# Patient Record
Sex: Female | Born: 1955 | Hispanic: Yes | Smoking: Current every day smoker
Health system: Southern US, Community
[De-identification: ages and names within clinical notes are randomized; demographics above are authoritative.]

## PROBLEM LIST (undated history)

## (undated) DIAGNOSIS — N200 Calculus of kidney: Secondary | ICD-10-CM

## (undated) DIAGNOSIS — I10 Essential (primary) hypertension: Secondary | ICD-10-CM

## (undated) DIAGNOSIS — N63 Unspecified lump in unspecified breast: Secondary | ICD-10-CM

## (undated) DIAGNOSIS — D0510 Intraductal carcinoma in situ of unspecified breast: Secondary | ICD-10-CM

## (undated) DIAGNOSIS — C50919 Malignant neoplasm of unspecified site of unspecified female breast: Secondary | ICD-10-CM

## (undated) HISTORY — DX: Essential (primary) hypertension: I10

## (undated) HISTORY — PX: WISDOM TOOTH EXTRACTION: SHX21

## (undated) HISTORY — DX: Unspecified lump in unspecified breast: N63.0

## (undated) HISTORY — DX: Intraductal carcinoma in situ of unspecified breast: D05.10

---

## 1979-10-19 HISTORY — PX: TUBAL LIGATION: SHX77

## 1994-10-18 HISTORY — PX: SHOULDER ARTHROSCOPY: SHX128

## 2005-08-09 ENCOUNTER — Emergency Department: Payer: Self-pay | Admitting: Emergency Medicine

## 2006-11-24 ENCOUNTER — Emergency Department: Payer: Self-pay | Admitting: Unknown Physician Specialty

## 2008-09-18 ENCOUNTER — Emergency Department: Payer: Self-pay | Admitting: Internal Medicine

## 2008-10-03 ENCOUNTER — Emergency Department: Payer: Self-pay | Admitting: Internal Medicine

## 2011-04-08 ENCOUNTER — Observation Stay: Payer: Self-pay | Admitting: Internal Medicine

## 2011-10-19 DIAGNOSIS — C50919 Malignant neoplasm of unspecified site of unspecified female breast: Secondary | ICD-10-CM

## 2011-10-19 HISTORY — DX: Malignant neoplasm of unspecified site of unspecified female breast: C50.919

## 2012-10-18 DIAGNOSIS — N63 Unspecified lump in unspecified breast: Secondary | ICD-10-CM

## 2012-10-18 DIAGNOSIS — D0510 Intraductal carcinoma in situ of unspecified breast: Secondary | ICD-10-CM

## 2012-10-18 HISTORY — DX: Intraductal carcinoma in situ of unspecified breast: D05.10

## 2012-10-18 HISTORY — DX: Unspecified lump in unspecified breast: N63.0

## 2012-11-22 ENCOUNTER — Emergency Department: Payer: Self-pay | Admitting: Emergency Medicine

## 2012-12-07 ENCOUNTER — Ambulatory Visit: Payer: Self-pay | Admitting: Internal Medicine

## 2013-01-22 ENCOUNTER — Ambulatory Visit: Payer: Self-pay | Admitting: Internal Medicine

## 2013-03-27 ENCOUNTER — Ambulatory Visit: Payer: Self-pay | Admitting: Internal Medicine

## 2013-04-23 ENCOUNTER — Ambulatory Visit (INDEPENDENT_AMBULATORY_CARE_PROVIDER_SITE_OTHER): Payer: Medicaid Other | Admitting: General Surgery

## 2013-04-23 ENCOUNTER — Encounter: Payer: Self-pay | Admitting: General Surgery

## 2013-04-23 ENCOUNTER — Other Ambulatory Visit: Payer: Self-pay

## 2013-04-23 VITALS — BP 120/74 | HR 72 | Resp 12 | Ht 60.0 in | Wt 132.0 lb

## 2013-04-23 DIAGNOSIS — R928 Other abnormal and inconclusive findings on diagnostic imaging of breast: Secondary | ICD-10-CM

## 2013-04-23 DIAGNOSIS — N63 Unspecified lump in unspecified breast: Secondary | ICD-10-CM

## 2013-04-23 DIAGNOSIS — R921 Mammographic calcification found on diagnostic imaging of breast: Secondary | ICD-10-CM

## 2013-04-23 NOTE — Patient Instructions (Addendum)
Patient to have right breast stereotactic breast biopsy done.   Breast Biopsy, Stereotactic A stereotactic breast biopsy takes a tissue sample from the breast with a special instrument. This is done when:  The problem (lump, abnormality, mass) can be seen on X-ray, but not felt on physical exam.  Suspicious, small calcium deposits (calcifications) are seen in the breast.  There is a change in shape or appearance of the breast, thickening, or asymmetry on mammogram (breast X-ray).  You have nipple changes (unusual or bloody discharge, crusting, retraction, dimpling).  Your caregiver is making a surgical diagnosis. The biopsy may be done on a special table, with your face down and your breasts placed through openings in the table. Computerized imaging (special form of X-rays) is used. The images are not obtained using regular X-ray film. So, exposure to radiation is reduced. Images are seen through several different angles. The surgeon removes small pieces of the suspicious tissue through a hollow needle. The tissue will be sent to the lab for analysis. The surgeon can look at the pictures right away, rather than wait for an X-ray to be developed. Your caregiver can mark the lesions (abnormal tissue formations) electronically. Then the computer can tell exactly where the problem is, or if it has moved. BENEFITS OF THE PROCEDURE  This is a good way to see if tiny lumps, abnormal looking tissue, or calcium deposits that you cannot feel are cancerous or require further treatment or follow-up.  Needle biopsy is a simple procedure. It may be performed in an outpatient imaging center. This means you have the procedure and go home the same day, without checking into a hospital.  It is less painful than open surgery. The results are as accurate as when a tissue sample is removed surgically.  The procedure is faster, less expensive, less invasive, does not distort the breast, and leaves little or no  scar.  Breast defects, which can make future mammograms hard to read and interpret, do not remain.  Recovery time is brief. Patients can soon resume their normal activities.  Using VAD (vacuum assisted device) may make it possible to remove entire lesions.  A breast biopsy can indicate if you need surgery, other treatment, or combined treatment. LET YOUR CAREGIVER KNOW ABOUT:  Allergies.  Medications taken, including herbs, eye drops, over-the-counter medications, and creams.  Use of steroids (by mouth or creams).  Previous problems with anesthetics or numbing medication.  If you are taking blood thinner medications or aspirin.  Possibility of pregnancy, if this applies.  History of blood clots (thrombophlebitis).  History of bleeding or blood problems.  Previous surgery.  Other health problems. RISKS AND COMPLICATIONS  Infection (germ growing in the wound). This can often be treated with antibiotics.  Bleeding, following surgery. Your surgeon takes every precaution to keep this from happening.  There is some concern that if a cancerous mass is present, cancer cells might be spread by the needle. Whether this actually happens is not known. It does not appear to be a significant risk.  X-ray guided breast biopsy is not infallible (not always correct). The problem may be missed or the extent of the problem may be underestimated. This would mean the biopsy did not manage to remove a piece of the diseased tissue or enough of the diseased tissue.  Lesions present, with calcium deposits scattered throughout the breast, are difficult to target by stereotactic method. Those lesions near the chest wall also are hard to learn about by this method.  If the mammogram shows only a vague change in tissue density, but no definite mass or nodule, the X-ray guided method may not be successful. Occasionally, even after a successful biopsy, the tissue diagnosis remains uncertain. A surgical  biopsy will be needed, if abnormal or precancerous cells are found on core biopsy.  Altering or deforming of the breast.  Unable to find, or missing the lesion.  Rarely, the needle may go through the chest wall into the lung area. TWO BIOPSY INSTRUMENTS MAY BE USED IN THE PROCEDURE The conventional biopsy device (core needle biopsy device) consists of an inner needle with a trough extending from it at one end, and an overlying sheath. It is attached to a spring-loaded mechanism that propels it forward. The trough fills with tissue. The outer sheath instantly moves forward to cut the tissue and keep it in the trough. Each sample is obtained in a fraction of a second. It is necessary to withdraw the needle after each sample is taken to collect the tissue.  A newer type of instrument, the VAD (vacuum assisted device), uses vacuum pressure to pull breast tissue into a needle and remove it. The needle does not need to be withdrawn after each sampling. Another advantage is that biopsies are obtained in an orderly manner, by rotating the device. This helps make sure that the entire area of interest will be sampled. When using the automated core biopsy needle, sampling is more random.  FOR COMFORT DURING THE TEST  Relax as much as possible.  Try to follow instructions, to speed up the test.  Let your caregiver know if you are uncomfortable, anxious, or in pain. PROCEDURE  You are awake during the procedure, and you go home the same day (outpatient). A specially trained radiologist will do this procedure. First, the skin is cleansed. Then, it is injected with a local anesthetic. A small nick is made in the skin, and the tip of the biopsy needle is put into the calculated site of the lesion. A special mammography machine uses ionizing radiation to help guide the radiologist's instrument to the site of the abnormal growth. At this point, stereo images are again obtained, to confirm that the needle tip is at  the problem area. Usually 5 to 10 samples are collected when doing a core biopsy. At least 12 are collected when using the vacuum assisted device (VAD). Then, a final set of images is obtained. If they show that the lesion has been mostly or completely removed, a small clip is left at the biopsy site. This is so that it can be easily located, in case the lesion turns out to be cancer. Afterward, the skin opening is stitched (sutured) or taped closed, and covered with a dressing. Your caregiver may apply a pressure dressing and an ice pack, to prevent bleeding and swelling in the breast.  X-ray guided breast biopsy can take 30 minutes to 1 hour, or more. The X-rays usually have no side effects, and no radiation remains in your body. There is usually little or no pain. Usually no scar is left from the tiny skin incision. Many women find that the major discomfort of the procedure is from lying on their stomach, or staying in 1 position for the length of the procedure. This discomfort may be reduced by carefully placed cushions. You should wear a good support bra to the procedure. You will be asked to remove jewelry, dentures, eye glasses, metal objects, or clothing that might interfere with the  X-ray images. You may want to have someone with you, to take you home after the procedure. AFTER THE PROCEDURE   After surgery, if you are doing well and have no problems, you will be allowed to go home.  You may resume your regular diet, or as directed by your caregiver. HOME CARE INSTRUCTIONS   Follow your caregiver's recommendations for medications, care of the biopsy site, follow-up appointments, and further treatment.  Only take over-the-counter or prescription medicines for pain, discomfort, or fever as directed by your caregiver.  An ice pack applied to the affected area may help with discomfort and keep the swelling down.  Change dressings as directed.  Wear a good support bra for as long as your  caregiver recommends.  Avoid strenuous activity for at least 24 hours, or as advised by your caregiver. Finding out the results of your test Not all test results are available during your visit. If your test results are not back during the visit, make an appointment with your caregiver to find out the results. Do not assume everything is normal if you have not heard from your caregiver or the medical facility. It is important for you to follow up on all of your test results.  SEEK MEDICAL CARE IF:   You develop a rash.  You have problems with your medicines.  You become lightheaded or dizzy. SEEK IMMEDIATE MEDICAL CARE IF:   There is increased bleeding (more than a small spot) from the biopsy site.  You notice redness, swelling, or increasing pain in the wound.  Pus is coming from the wound.  You have a fever.  You notice a bad smell coming from the wound or dressing.  You develop shortness of breath.  You develop chest pain.  You pass out. Document Released: 07/03/2003 Document Revised: 12/27/2011 Document Reviewed: 08/08/2009 [facility] Patient Information 2014 [place], [place].      CARE AFTER BREAST BIOPSY  1. Leave the dressing on that your doctor applied after surgery. It is waterproof. You may bathe, shower and/or swim. The dressing will probably remain intact until your return office visit. If the dressing comes off, you will see small strips of tape against your skin on the incision. Do not remove these strips.  2. You may want to use a gauze,cloth or similar protection in your bra to prevent rubbing against your dressing and incision. This is not necessary, but you may feel more comfortable doing so.  3. It is recommended that you wear a bra day and night to give support to the breast. This will prevent the weight of the breast from pulling on the incision.  4. Your breast will feel hard and lumpy under the incision. Do not be alarmed. This is the underlying stitching  of tissue. Softening of this tissue will occur in time.  5. Make sure you call the office and schedule an appointment in one week after your surgery. The office phone number is [phone]. The [facility] at [facility] may have already done this for you.  6. You will notice about a week after your office visit that the strips of the tape on your incision will begin to loosen. These may then be removed.  7. Report to your doctor any of the following:  * Severe pain not relieved by your pain medication  *Redness of the incision  * Drainage from the incision  *Fever greater than 101 degrees  Patient has been scheduled for a right breast stereotactic biopsy  at [facility] for 05-07-13 at 2 pm. She will check-in at the [facility] at 1:30 pm. This patient is aware of date, time, and instructions. Patient verbalizes understanding.

## 2013-04-23 NOTE — Progress Notes (Signed)
Patient ID: Tricia Wilson, female   DOB: 06/15/1956, 57 y.o.   MRN: [id number]  Chief Complaint  Patient presents with  . Other    mammogram    HPI Tricia Wilson is a 57 y.o. female here today for an abnormal mammogram done at [facility] on 03/27/13 with a birad category 4. A left breast ultrasound was performed that same day. The patient does do self breast checks but does not get regular mammograms. She has a family history of two maternal aunts with breast cancer. She denies any complaints with her breasts at this time.    HPI  Past Medical History  Diagnosis Date  . Hypertension     Past Surgical History  Procedure Laterality Date  . Tubal ligation    . Spine surgery      Family History  Problem Relation Age of Onset  . Cancer Maternal Aunt     breast  . Cancer Maternal Aunt     breast    Social History History  Substance Use Topics  . Smoking status: Current Every Day Smoker -- 5.00 packs/day for 20 years  . Smokeless tobacco: Never Used  . Alcohol Use: Yes    Allergies  Allergen Reactions  . Motrin (Ibuprofen)     Current Outpatient Prescriptions  Medication Sig Dispense Refill  . amLODipine (NORVASC) 5 MG tablet Take 5 mg by mouth daily.      . hydrochlorothiazide (HYDRODIURIL) 25 MG tablet Take 25 mg by mouth daily.       No current facility-administered medications for this visit.    Review of Systems Review of Systems  Constitutional: Negative.   Respiratory: Negative.   Cardiovascular: Negative.     Blood pressure 120/74, pulse 72, resp. rate 12, height 5' (1.524 m), weight 132 lb (59.875 kg).  Physical Exam Physical Exam  Constitutional: She is oriented to person, place, and time. She appears well-developed and well-nourished.  Eyes: Conjunctivae are normal. No scleral icterus.  Neck: Trachea normal. No mass and no thyromegaly present.  Cardiovascular: Normal rate, regular rhythm and normal heart sounds.   No murmur heard. Pulmonary/Chest: Effort  normal and breath sounds normal. Right breast exhibits no inverted nipple, no mass, no nipple discharge, no skin change and no tenderness. Left breast exhibits no inverted nipple, no mass, no nipple discharge, no skin change and no tenderness. Breasts are symmetrical.  Lymphadenopathy:    She has no cervical adenopathy.    She has no axillary adenopathy.  Neurological: She is alert and oriented to person, place, and time.  Skin: Skin is warm and dry.    Data Reviewed Mammogram and ultrasound reviewed.   Assessment    Left breast mass seen by ultrasound.  Right breast microcalcification's seen by mammography.    Plan    Left breast core biopsy and right breast stereo biopsy. Procedure risk and benefits explained to patient in full. Her Daughter Tricia Wilson) was present to interpret.     Patient has been scheduled for a right breast stereotactic biopsy at [facility] for 05-07-13 at 2 pm. She will check-in at the [facility] at 1:30 pm. This patient is aware of date, time, and instructions. Patient verbalizes understanding.   CORE BREAST BIOPSY REPORT  Name:  Tricia Wilson DOB:  [birth year]  Vital signs:BP 120/74  Pulse 72  Resp 12  Ht 5' (1.524 m)  Wt 132 lb (59.875 kg)  BMI 25.78 kg/m2  Lesion:  Mass  Location:  Left  1 o'clock  position  Local anesthetic:   8 ml of 1% Xylocaine/0.5% Marcaine  Prep:  chloroprep  Device:  14Gauge   Bard    Ultrasound guidance:  used  Patient tolerance:  Patient tolerated the procedure well   Approach: CC  Clip:not used  Dressing: steristrips,telfa and tegaderm  Ice pack applied. Written instructions provided to patient regarding wound care.   Patient advised that patient will be contacted by phone when pathology report is available.  Followup appointment to be scheduled after pathology.   CC: MASOUD,JAVED, MD, Tarri Glenn G 04/23/2013, 9:06 PM

## 2013-04-24 LAB — PATHOLOGY

## 2013-04-25 ENCOUNTER — Telehealth: Payer: Self-pay | Admitting: *Deleted

## 2013-04-25 ENCOUNTER — Encounter: Payer: Self-pay | Admitting: General Surgery

## 2013-04-25 NOTE — Telephone Encounter (Signed)
Notified patient as instructed, left breast biopsy results benign, with daughter assist, patient pleased. Discussed follow-up appointments for right breast stereo, patient agrees

## 2013-04-25 NOTE — Telephone Encounter (Signed)
Message copied by Currie Paris on Wed Apr 25, 2013  8:27 AM ------      Message from: Kieth Brightly      Created: Tue Apr 24, 2013  5:10 PM       Please let pt pt know the pathology was normal. She is scheduled for right breast stereo. ------

## 2013-05-03 ENCOUNTER — Ambulatory Visit: Payer: Self-pay | Admitting: General Surgery

## 2013-05-07 ENCOUNTER — Ambulatory Visit: Payer: Self-pay | Admitting: General Surgery

## 2013-05-07 DIAGNOSIS — R920 Mammographic microcalcification found on diagnostic imaging of breast: Secondary | ICD-10-CM

## 2013-05-08 ENCOUNTER — Telehealth: Payer: Self-pay | Admitting: *Deleted

## 2013-05-08 NOTE — Telephone Encounter (Signed)
Per Dr. 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

## 2013-05-09 ENCOUNTER — Encounter: Payer: Self-pay | Admitting: General Surgery

## 2013-05-10 ENCOUNTER — Ambulatory Visit (INDEPENDENT_AMBULATORY_CARE_PROVIDER_SITE_OTHER): Payer: Medicaid Other | Admitting: General Surgery

## 2013-05-10 ENCOUNTER — Other Ambulatory Visit: Payer: Self-pay

## 2013-05-10 ENCOUNTER — Encounter: Payer: Self-pay | Admitting: General Surgery

## 2013-05-10 VITALS — Ht 62.0 in | Wt 132.0 lb

## 2013-05-10 DIAGNOSIS — N63 Unspecified lump in unspecified breast: Secondary | ICD-10-CM

## 2013-05-10 DIAGNOSIS — D0511 Intraductal carcinoma in situ of right breast: Secondary | ICD-10-CM

## 2013-05-10 DIAGNOSIS — D0590 Unspecified type of carcinoma in situ of unspecified breast: Secondary | ICD-10-CM

## 2013-05-10 DIAGNOSIS — D0510 Intraductal carcinoma in situ of unspecified breast: Secondary | ICD-10-CM | POA: Insufficient documentation

## 2013-05-10 NOTE — Progress Notes (Signed)
Patient ID: Tricia Wilson, female   DOB: October 25, 1955, 57 y.o.   MRN: [id number] Pt here for discussion on path. Left breast biopsy showed benign breast tissue. This is discordant and likely needs excision of the mass at 2 o'cl on left. However [place] today showed no defined mass. Stereo bx of right breast showed a tiny focus of DCIS. Pt advised fully on the diagnosis.  Treatment options explained. Pt is agreeable to lumpectomy right breast

## 2013-05-11 NOTE — Addendum Note (Signed)
Addended by: Kieth Brightly on: 05/11/2013 07:26 AM   Modules accepted: Orders

## 2013-05-16 ENCOUNTER — Ambulatory Visit: Payer: Self-pay | Admitting: General Surgery

## 2013-05-16 DIAGNOSIS — D0590 Unspecified type of carcinoma in situ of unspecified breast: Secondary | ICD-10-CM

## 2013-05-16 HISTORY — PX: BREAST SURGERY: SHX581

## 2013-05-17 ENCOUNTER — Ambulatory Visit: Payer: Medicaid Other

## 2013-05-17 LAB — PATHOLOGY REPORT

## 2013-05-18 ENCOUNTER — Ambulatory Visit: Payer: Self-pay | Admitting: Internal Medicine

## 2013-05-21 ENCOUNTER — Encounter: Payer: Self-pay | Admitting: General Surgery

## 2013-05-23 ENCOUNTER — Encounter: Payer: Self-pay | Admitting: General Surgery

## 2013-05-24 ENCOUNTER — Encounter: Payer: Self-pay | Admitting: General Surgery

## 2013-05-31 ENCOUNTER — Encounter: Payer: Self-pay | Admitting: General Surgery

## 2013-05-31 ENCOUNTER — Ambulatory Visit (INDEPENDENT_AMBULATORY_CARE_PROVIDER_SITE_OTHER): Payer: Medicaid Other | Admitting: General Surgery

## 2013-05-31 VITALS — BP 110/80 | HR 76 | Resp 12 | Ht 60.0 in | Wt 133.0 lb

## 2013-05-31 DIAGNOSIS — D0590 Unspecified type of carcinoma in situ of unspecified breast: Secondary | ICD-10-CM

## 2013-05-31 DIAGNOSIS — D0511 Intraductal carcinoma in situ of right breast: Secondary | ICD-10-CM

## 2013-05-31 NOTE — Patient Instructions (Addendum)
Patient to see Dr. Rushie Chestnut. This patient is to return in 2 months.   Patient has been scheduled for an appointment with Dr. Rushie Chestnut at the [facility] for 06-05-13 at 11:30 am. She is aware of date, time, and instructions.

## 2013-05-31 NOTE — Progress Notes (Signed)
Patient ID: Tricia Wilson, female   DOB: Jun 12, 1956, 57 y.o.   MRN: [id number]  Patient presents for a post op right breast lumpectomy. The procedure was done on 05/16/13. The patient complains of some dizziness that comes and goes. She also states she is having a pinching sensation in the area of the incision site. She is still using Tramadol for pain but states it is not helping her.   Exam shows a clean incision around the areola on right side. No signs of infection, hematoma, or seroma.    Pathology no residual DCIS. Margins clear. ER PR positive.   Patient has been scheduled for an appointment with Dr. Rushie Chestnut at the [facility] for 06-05-13 at 11:30 am. She is aware of date, time, and instructions.

## 2013-06-05 ENCOUNTER — Ambulatory Visit: Payer: Self-pay | Admitting: Radiation Oncology

## 2013-06-18 ENCOUNTER — Ambulatory Visit: Payer: Self-pay | Admitting: Internal Medicine

## 2013-06-18 ENCOUNTER — Ambulatory Visit: Payer: Self-pay | Admitting: Radiation Oncology

## 2013-07-05 LAB — CBC CANCER CENTER
Basophil %: 0.5 %
Eosinophil %: 1.4 %
Lymphocyte %: 28.2 %
MCHC: 33.3 g/dL (ref 32.0–36.0)
MCV: 93 fL (ref 80–100)
Monocyte #: 0.6 x10 3/mm (ref 0.2–0.9)
RDW: 13.5 % (ref 11.5–14.5)

## 2013-07-12 LAB — CBC CANCER CENTER
Eosinophil #: 0.1 x10 3/mm (ref 0.0–0.7)
Eosinophil %: 1.3 %
HGB: 15.6 g/dL (ref 12.0–16.0)
Lymphocyte %: 32.3 %
MCH: 31.6 pg (ref 26.0–34.0)
MCHC: 34.9 g/dL (ref 32.0–36.0)
Monocyte #: 0.5 x10 3/mm (ref 0.2–0.9)
RBC: 4.92 10*6/uL (ref 3.80–5.20)
RDW: 13.1 % (ref 11.5–14.5)
WBC: 6.4 x10 3/mm (ref 3.6–11.0)

## 2013-07-18 ENCOUNTER — Ambulatory Visit: Payer: Self-pay | Admitting: Internal Medicine

## 2013-07-18 ENCOUNTER — Ambulatory Visit: Payer: Self-pay | Admitting: Radiation Oncology

## 2013-07-19 LAB — CBC CANCER CENTER
Basophil #: 0.1 x10 3/mm (ref 0.0–0.1)
Basophil %: 1.1 %
HCT: 48.1 % — ABNORMAL HIGH (ref 35.0–47.0)
HGB: 16.5 g/dL — ABNORMAL HIGH (ref 12.0–16.0)
Monocyte #: 0.4 x10 3/mm (ref 0.2–0.9)
Monocyte %: 6.5 %
Neutrophil #: 4.3 x10 3/mm (ref 1.4–6.5)
Neutrophil %: 63.3 %
RDW: 13.6 % (ref 11.5–14.5)

## 2013-07-26 LAB — CBC CANCER CENTER
Eosinophil #: 0.1 x10 3/mm (ref 0.0–0.7)
Eosinophil %: 1.4 %
HGB: 15 g/dL (ref 12.0–16.0)
Lymphocyte #: 1.8 x10 3/mm (ref 1.0–3.6)
MCV: 93 fL (ref 80–100)
Monocyte %: 5.6 %
Neutrophil #: 4.2 x10 3/mm (ref 1.4–6.5)
Platelet: 223 x10 3/mm (ref 150–440)
RDW: 13.5 % (ref 11.5–14.5)
WBC: 6.6 x10 3/mm (ref 3.6–11.0)

## 2013-08-01 ENCOUNTER — Encounter: Payer: Self-pay | Admitting: General Surgery

## 2013-08-01 ENCOUNTER — Ambulatory Visit (INDEPENDENT_AMBULATORY_CARE_PROVIDER_SITE_OTHER): Payer: Medicaid Other | Admitting: General Surgery

## 2013-08-01 VITALS — BP 130/80 | HR 78 | Resp 12 | Ht 60.0 in | Wt 136.0 lb

## 2013-08-01 DIAGNOSIS — D0511 Intraductal carcinoma in situ of right breast: Secondary | ICD-10-CM

## 2013-08-01 DIAGNOSIS — D0590 Unspecified type of carcinoma in situ of unspecified breast: Secondary | ICD-10-CM

## 2013-08-01 MED ORDER — TRAMADOL HCL 50 MG PO TABS: 50.0000 mg | ORAL_TABLET | Freq: Three times a day (TID) | ORAL | Status: DC | PRN

## 2013-08-01 NOTE — Progress Notes (Signed)
Patient ID: Tricia Wilson, female   DOB: 1956-07-03, 57 y.o.   MRN: [id number]  Chief Complaint  Patient presents with  . Follow-up    right breast    HPI [name] is a 57 y.o. 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 Patient states her right breast is very itchy.She uses a cream with no relief of symptoms. Patient has completed 4 weeks of radiation and has 2 more weeks remaining.  HPI  Past Medical History  Diagnosis Date  . Hypertension   . Cancer 2014    rt breast ductal carcinoma in situ  . Breast lump 2014    Past Surgical History  Procedure Laterality Date  . Tubal ligation    . Spine surgery    . Breast surgery Right 2014    lumpectomy    Family History  Problem Relation Age of Onset  . Cancer Maternal Aunt     breast  . Cancer Maternal Aunt     breast    Social History History  Substance Use Topics  . Smoking status: Current Every Day Smoker -- 5.00 packs/day for 20 years  . Smokeless tobacco: Never Used  . Alcohol Use: Yes    Allergies  Allergen Reactions  . Motrin [Ibuprofen]     Current Outpatient Prescriptions  Medication Sig Dispense Refill  . amLODipine (NORVASC) 5 MG tablet Take 5 mg by mouth daily.      . hydrochlorothiazide (HYDRODIURIL) 25 MG tablet Take 25 mg by mouth daily.      . traMADol (ULTRAM) 50 MG tablet Take 1 tablet (50 mg total) by mouth 3 (three) times daily as needed for pain.  30 tablet  0   No current facility-administered medications for this visit.    Review of Systems Review of Systems  Constitutional: Negative.   Respiratory: Negative.   Cardiovascular: Negative.     Blood pressure 130/80, pulse 78, resp. rate 12, height 5' (1.524 m), weight 136 lb (61.689 kg).  Physical Exam Physical Exam  Constitutional: She is oriented to person, place, and time. She appears well-developed and well-nourished.  Neurological: She is alert and oriented to person,  place, and time.  Right breast shows radiational skin changes but no open areas or drainage. Lumpectomy site near the areola looks intact and no nipple changes seen.   Data Reviewed    Assessment    DCIS R breast, ER/PR positive. Completing radiation treatment.      Plan    Advised to use zinc oxide for breast itchiness. Follow-up in three weeks.    Tramadol refill called in to pharmacy.    Grier Vu G 08/01/2013, 4:30 PM

## 2013-08-02 LAB — CBC CANCER CENTER
Basophil #: 0.1 x10 3/mm (ref 0.0–0.1)
Basophil %: 1.1 %
Lymphocyte #: 1.5 x10 3/mm (ref 1.0–3.6)
Lymphocyte %: 23.1 %
MCHC: 34.1 g/dL (ref 32.0–36.0)
Monocyte #: 0.4 x10 3/mm (ref 0.2–0.9)
Neutrophil %: 68 %
Platelet: 236 x10 3/mm (ref 150–440)
RDW: 13.2 % (ref 11.5–14.5)

## 2013-08-09 LAB — CBC CANCER CENTER
Basophil #: 0 x10 3/mm (ref 0.0–0.1)
Eosinophil #: 0.2 x10 3/mm (ref 0.0–0.7)
Eosinophil %: 3.2 %
HCT: 46.7 % (ref 35.0–47.0)
HGB: 15.9 g/dL (ref 12.0–16.0)
MCHC: 34.1 g/dL (ref 32.0–36.0)
MCV: 94 fL (ref 80–100)
Monocyte #: 0.4 x10 3/mm (ref 0.2–0.9)
Monocyte %: 6.9 %
Platelet: 198 x10 3/mm (ref 150–440)
RBC: 4.95 10*6/uL (ref 3.80–5.20)
RDW: 13.7 % (ref 11.5–14.5)
WBC: 5.9 x10 3/mm (ref 3.6–11.0)

## 2013-08-14 LAB — CREATININE, SERUM
Creatinine: 1.1 mg/dL (ref 0.60–1.30)
EGFR (African American): >60

## 2013-08-14 LAB — CBC CANCER CENTER
Eosinophil #: 0.1 x10 3/mm (ref 0.0–0.7)
Eosinophil %: 2.6 %
Lymphocyte #: 1.1 x10 3/mm (ref 1.0–3.6)
MCH: 31.9 pg (ref 26.0–34.0)
MCHC: 34.5 g/dL (ref 32.0–36.0)
Monocyte #: 0.3 x10 3/mm (ref 0.2–0.9)
Monocyte %: 6.4 %
Neutrophil #: 3.6 x10 3/mm (ref 1.4–6.5)
Neutrophil %: 68.1 %
Platelet: 197 x10 3/mm (ref 150–440)
RBC: 4.56 10*6/uL (ref 3.80–5.20)

## 2013-08-14 LAB — HEPATIC FUNCTION PANEL A (ARMC)
Albumin: 3.3 g/dL — ABNORMAL LOW (ref 3.4–5.0)
Alkaline Phosphatase: 89 U/L (ref 50–136)
Bilirubin,Total: 0.4 mg/dL (ref 0.2–1.0)

## 2013-08-18 ENCOUNTER — Ambulatory Visit: Payer: Self-pay | Admitting: Radiation Oncology

## 2013-08-18 ENCOUNTER — Ambulatory Visit: Payer: Self-pay | Admitting: Internal Medicine

## 2013-08-21 ENCOUNTER — Encounter: Payer: Self-pay | Admitting: General Surgery

## 2013-08-21 ENCOUNTER — Ambulatory Visit (INDEPENDENT_AMBULATORY_CARE_PROVIDER_SITE_OTHER): Payer: Medicaid Other | Admitting: General Surgery

## 2013-08-21 VITALS — BP 124/80 | HR 86 | Resp 12 | Ht 60.0 in | Wt 136.0 lb

## 2013-08-21 DIAGNOSIS — D0511 Intraductal carcinoma in situ of right breast: Secondary | ICD-10-CM

## 2013-08-21 DIAGNOSIS — D0590 Unspecified type of carcinoma in situ of unspecified breast: Secondary | ICD-10-CM

## 2013-08-21 NOTE — Progress Notes (Deleted)
Patient ID: Tricia Wilson, female   DOB: 1956-09-20, 57 y.o.   MRN: [id number]

## 2013-08-21 NOTE — Patient Instructions (Signed)
Right diagnostic mammogram and office visit in January 2015

## 2013-08-21 NOTE — Progress Notes (Signed)
Patient ID: Tricia Wilson, female   DOB: [birth year], 57 y.o.   MRN: [id number]  Chief Complaint  Patient presents with  . Routine Post Op    right breast    HPI Tricia Wilson is a 57 y.o. female.  Here for her follow up.  She completed radiation 08-17-13.  States she is feeling tired.  She started her Tamoxifen yesterday, prescription from Dr. Sherrlyn Hock. No new breast issues. States she starts physical therapy next week. HPI  Past Medical History  Diagnosis Date  . Hypertension   . Cancer 2014    rt breast ductal carcinoma in situ  . Breast lump 2014    Past Surgical History  Procedure Laterality Date  . Tubal ligation    . Spine surgery    . Breast surgery Right 05-16-2013    lumpectomy    Family History  Problem Relation Age of Onset  . Cancer Maternal Aunt     breast  . Cancer Maternal Aunt     breast    Social History History  Substance Use Topics  . Smoking status: Current Every Day Smoker -- 5.00 packs/day for 20 years  . Smokeless tobacco: Never Used  . Alcohol Use: Yes    Allergies  Allergen Reactions  . Motrin [Ibuprofen]     Current Outpatient Prescriptions  Medication Sig Dispense Refill  . tamoxifen (NOLVADEX) 20 MG tablet Take 20 mg by mouth daily.      Marland Kitchen amLODipine (NORVASC) 5 MG tablet Take 5 mg by mouth daily.      . hydrochlorothiazide (HYDRODIURIL) 25 MG tablet Take 25 mg by mouth daily.      . traMADol (ULTRAM) 50 MG tablet Take 1 tablet (50 mg total) by mouth 3 (three) times daily as needed for pain.  30 tablet  0   No current facility-administered medications for this visit.    Review of Systems Review of Systems  Constitutional: Positive for fatigue.  Respiratory: Negative.   Cardiovascular: Negative.     Blood pressure 124/80, pulse 86, resp. rate 12, height 5' (1.524 m), weight 136 lb (61.689 kg).  Physical Exam Physical Exam  Constitutional: She is oriented to person, place, and time. She appears well-developed and well-nourished.   Neck: Neck supple. No thyromegaly present.  Pulmonary/Chest: Right breast exhibits no inverted nipple, no mass, no nipple discharge, no skin change and no tenderness. Left breast exhibits no inverted nipple, no mass, no nipple discharge, no skin change and no tenderness.  Right breast with skin changes from radiation but no open areas.  Lymphadenopathy:    She has no cervical adenopathy.    She has no axillary adenopathy.  Neurological: She is alert and oriented to person, place, and time.  Skin: Skin is warm and dry.    Data Reviewed    Assessment    DCIS right breast     Plan    F/u in 2 mos with right diagnostic mammogram.        Lindsay Soulliere G 08/21/2013, 2:58 PM

## 2013-09-21 ENCOUNTER — Ambulatory Visit: Payer: Self-pay | Admitting: Radiation Oncology

## 2013-10-23 ENCOUNTER — Ambulatory Visit: Payer: Self-pay | Admitting: General Surgery

## 2013-10-24 ENCOUNTER — Encounter: Payer: Self-pay | Admitting: General Surgery

## 2013-10-31 ENCOUNTER — Ambulatory Visit: Payer: Medicaid Other | Admitting: General Surgery

## 2013-11-27 ENCOUNTER — Encounter: Payer: Self-pay | Admitting: *Deleted

## 2013-12-17 ENCOUNTER — Ambulatory Visit: Payer: Self-pay | Admitting: Internal Medicine

## 2014-08-15 ENCOUNTER — Ambulatory Visit: Payer: Self-pay | Admitting: Internal Medicine

## 2014-08-19 ENCOUNTER — Encounter: Payer: Self-pay | Admitting: General Surgery

## 2014-08-20 ENCOUNTER — Other Ambulatory Visit: Payer: Self-pay | Admitting: Cardiology

## 2014-08-20 DIAGNOSIS — R921 Mammographic calcification found on diagnostic imaging of breast: Secondary | ICD-10-CM

## 2014-08-28 ENCOUNTER — Ambulatory Visit
Admission: RE | Admit: 2014-08-28 | Discharge: 2014-08-28 | Disposition: A | Discharge status: Home / Self Care | Payer: Medicaid Other | Source: Ambulatory Visit | Attending: Cardiology | Admitting: Cardiology

## 2014-08-28 DIAGNOSIS — R921 Mammographic calcification found on diagnostic imaging of breast: Secondary | ICD-10-CM

## 2014-08-28 MED ORDER — GADOBENATE DIMEGLUMINE 529 MG/ML IV SOLN
12.0000 mL | Freq: Once | INTRAVENOUS | Status: AC | PRN
  Administered 2014-08-28: 12 mL via INTRAVENOUS

## 2015-02-07 NOTE — Op Note (Signed)
PATIENT NAME:  Tricia Wilson, PETHTEL MR#:  [id number] DATE OF BIRTH:  Nov 10, 1955  DATE OF PROCEDURE:  05/16/2013  PREOPERATIVE DIAGNOSIS: Ductal carcinoma in situ, right breast.   POSTOPERATIVE DIAGNOSIS: Ductal carcinoma in situ, right breast.    OPERATION: Right breast lumpectomy with ultrasound guidance.   SURGEON: S.G. Jamal Collin, MD  ANESTHESIA: General.   COMPLICATIONS: None.   ESTIMATED BLOOD LOSS: Minimal.   DRAINS: None.   DESCRIPTION OF PROCEDURE: The patient was put to sleep in the supine position on the operating table. The right breast was prepped and draped out as a sterile field. Timeout procedure was performed. Ultrasound probe was brought up to the field, and the biopsy cavity with the clip was identified in the retroareolar position in a superior location. This was marked on the skin, and a circumareolar incision was planned along the upper and outer aspect. Incision was then made and carefully deepened into the subcutaneous tissue. The areolar skin was then dissected off from the underlying tissue all the way past the nipple area, and the skin and subcutaneous tissue were dissected off the underlying gland superiorly, and with finger palpation, the biopsy cavity was completely excised out with cautery. Bleeding was controlled with cautery. The excised tissue was tagged for margins and sent to pathology. The deeper tissue was closed with interrupted 2-0 Vicryl stitches, and the skin was closed with subcuticular 4-0 Vicryl, covered with Dermabond. The procedure was well tolerated. No immediate problems were encountered. The patient subsequently was returned to the recovery room in stable condition.   ____________________________ S.Robinette Haines, MD sgs:OSi D: 05/18/2013 14:15:26 ET T: 05/18/2013 14:21:42 ET JOB#: [id number]  cc: S.G. Jamal Collin, MD, <Dictator> [facility] Robinette Haines MD ELECTRONICALLY SIGNED 05/21/2013 9:29

## 2015-02-07 NOTE — Consult Note (Signed)
Reason for Visit: This 59 year old Female patient presents to the clinic for initial evaluation of  breast cancer .   Referred by Dr. Jamal Collin.  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   Planned Treatment Regimen whole breast radiation plus tamoxifen   HPI   patient is a59 year old Spanish female who presented with an abnormal mammogram on January 22, 2013 showing a cluster of microcalcifications in retroalveolar portion of the right breast. This was confirmed on ultrasound to be a BI-RADS for suspicious abnormality and patient underwent wide local excisionfor ductal carcinoma in situ. Tumor was approximately 3 mm. Tumor was low-grade cribriform type ER/PR positive. Her margin was involved should underwent reexcision showing no residual ductal carcinoma in situ. Marker clips were placed.patient has done well postoperatively. She is seen today with the aid of an interpreter and is doing well. Specifically denies breast tenderness cough or bone pain. She is also being seen by medical oncology today for consideration of aromatase inhibitor therapy  Past Hx:    DCIS Right breast:    Tobacco Use:    Hypertension:    Wisdom Tooth Extraction:    Shoulder Surgery:    Back Surgery:    Tubal Ligation Reversal:    Tubal Ligation:   Past, Family and Social History:  Past Medical History positive   Cardiovascular hypertension   Past Surgical History back surgery, shoulder surgery, wisdom tooth extraction, tubal ligation, tubal ligation reversal   Family History positive   Family History Comments maternal aunt with breast cancer   Social History positive   Social History Comments 20-pack-year smoking history smoking  cessation literature has been given to patient. Social EtOH use history   Additional Past Medical and Surgical History accompanied by interpreter today Anderson navigator   Allergies:   Motrin: N/V  Home Meds:  Home Medications: Medication Instructions Status  hydrochlorothiazide 25 mg oral tablet 1 tab(s) orally once a day Active  amLODIPine 5 mg oral tablet 1 tab(s) orally once a day (in the morning) Active  traMADol 50 mg oral tablet 1 tab(s) orally every 6 hours as needed for pain Active   Review of Systems:  General negative   Performance Status (ECOG) 0   Skin negative   Breast see HPI   Ophthalmologic negative   ENMT negative   Respiratory and Thorax negative   Cardiovascular negative   Gastrointestinal negative   Genitourinary negative   Musculoskeletal negative   Neurological negative   Psychiatric negative   Hematology/Lymphatics negative   Endocrine negative   Allergic/Immunologic negative   Nursing Notes:  Nursing Vital Signs and Chemo Nursing Nursing Notes: *CC Vital Signs Flowsheet:   19-Aug-14 11:00  Temp Temperature 96.9  Pulse Pulse 71  Respirations Respirations 18  SBP SBP 122  DBP DBP 84  Pain Scale (0-10)  0  Current Weight (kg) (kg) 60  Height (cm) centimeters 151.5  BSA (m2) 1.5   Physical Exam:  General/Skin/HEENT:  General normal   Skin normal   Eyes normal   ENMT normal   Head and Neck normal   Additional PE well-developed well-nourished female in NAD. She status post wide local excision in the superior portion of the nipple area looks complex of the right breast. Incision is  fresh will healing well. No dominant mass or nodularity is noted in either breast into position examined. No axillary or supraclavicular adenopathy is appreciated. Lungs are clear to A&P cardiac examination shows regular rate and rhythm.   Breasts/Resp/CV/GI/GU:  Respiratory and Thorax normal   Cardiovascular normal   Gastrointestinal  normal   Genitourinary normal   MS/Neuro/Psych/Lymph:  Musculoskeletal normal   Neurological normal   Lymphatics normal   Other Results:  Radiology Results: [place]:    10-Jun-14 11:25, US Breast Left  US Breast Left   REASON FOR EXAM:    av lt nodular densities rt microcals  COMMENTS:       PROCEDURE: [place]  - US BREAST LEFT  - Mar 27 2013 11:25AM     RESULT: Left breast ultrasound reveals a complex lesion with internal   echoes measuring 2 cm in maximum diameter at 2:00 in the left breast;   this corresponds to mammographic abnormality. Surgical evaluation of this   lesion is suggested as this could represent malignancy.    IMPRESSION:  Ill-defined lobular  approximately 2 cm lesion with internal   echoes outer aspect left breast. Surgical evaluation suggested as this   could represent malignancy.    BI-RADS: Category 4 - Suspicious Abnormality - Biopsy Should Be Considered  Thank you for the oppurtunity to contribute to the care of your patient. .        Verified By: Osa Craver, M.D., MD  LabUnknown:    07-Apr-14 10:45, Digital Screen Mammogram  PACS Image     10-Jun-14 11:25, US Breast Left  PACS [place]:    07-Apr-14 10:45, Digital Screen Mammogram  Digital Screen Mammogram   REASON FOR EXAM:    SCR MAMMO  COMMENTS:       PROCEDURE: MAM - MAM DGTL SCREENING MAMMO W/CAD  - Jan 22 2013 10:45AM     RESULT:  No prior studies available for comparison. Nodular densities   noted in the outer aspect of the left breast. Compression spot studies if   need be ultrasound suggest for further evaluation. Scattered   calcifications are noted bilaterally. There is a questionable cluster   microcalcifications in the retroareolar portion of the right breast for   which magnification spot studies are suggested.    IMPRESSION:    1. Nodular densities in the outer aspect left breast for which   compression spot studies if need be ultrasound suggest for further      evaluation.  2. Questionable cluster microcavitation retroareolar portion right breast   for which magnification spot studies suggested for further evaluation.    BI-RADS: Catagory 0 - Need Additional Imaging Evaluation    A NEGATIVE MAMMOGRAM REPORT DOES NOT PRECLUDE BIOPSY OR OTHER EVALUATION   OF A CLINICALLY PALPABLEOR OTHERWISE SUSPICIOUS MASS OR LESION. BREAST   CANCER MAY NOT BE DETECTED IN UP TO 10% OF CASES.    Thank you for the oppurtunity to contribute to the care of your patient. Marland Kitchen    BREAST COMPOSITION: The breast composition is HETEROGENEOUSLY DENSE   (glandular tissue is 51-75%) This may decrease the sensitivity of   mammography.  Addendum: In the above impression microcalcification was misspelled.        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 Would also boost or scar another 1400 cGy. Risks and benefits of treatment including skin irritation, inclusion of possible some superficial lung, alteration of blood counts, were explained in detail to the patient through the interpreter. She acknowledges to comprehend in my treatment plan well. Patient will also be seen by Dr. Ma Hillock and recommendation for tamoxifen therapy after radiation will be made. I have set her up for CT simulation next week to allow some more healing time. I have discussed the case personally with Dr. Ma Hillock and Dr. Jamal Collin .  I would like to take this opportunity to thank you for allowing me to continue to participate in this patient's care.    CC Referral:  cc: Dr. Jamal Collin   Electronic Signatures: Baruch Gouty, Roda Shutters (MD)  (Signed 19-Aug-14 13:31)  Authored: HPI, Diagnosis, Past Hx, PFSH, Allergies, Home Meds, ROS,  Nursing Notes, Physical Exam, Other Results, Encounter Assessment and Plan, CC Referring Physician   Last Updated: 19-Aug-14 13:31 by Armstead Peaks (MD)

## 2015-07-15 ENCOUNTER — Other Ambulatory Visit: Payer: Self-pay | Admitting: *Deleted

## 2015-07-15 ENCOUNTER — Emergency Department: Payer: Medicaid Other

## 2015-07-15 ENCOUNTER — Encounter: Payer: Self-pay | Admitting: Emergency Medicine

## 2015-07-15 ENCOUNTER — Emergency Department
Admission: EM | Admit: 2015-07-15 | Discharge: 2015-07-15 | Disposition: A | Discharge status: Home / Self Care | Payer: Medicaid Other | Attending: Emergency Medicine | Admitting: Emergency Medicine

## 2015-07-15 DIAGNOSIS — N200 Calculus of kidney: Secondary | ICD-10-CM | POA: Insufficient documentation

## 2015-07-15 DIAGNOSIS — S3992XA Unspecified injury of lower back, initial encounter: Secondary | ICD-10-CM | POA: Diagnosis present

## 2015-07-15 DIAGNOSIS — W1839XA Other fall on same level, initial encounter: Secondary | ICD-10-CM | POA: Insufficient documentation

## 2015-07-15 DIAGNOSIS — Y998 Other external cause status: Secondary | ICD-10-CM | POA: Insufficient documentation

## 2015-07-15 DIAGNOSIS — I10 Essential (primary) hypertension: Secondary | ICD-10-CM | POA: Diagnosis not present

## 2015-07-15 DIAGNOSIS — Y9389 Activity, other specified: Secondary | ICD-10-CM | POA: Insufficient documentation

## 2015-07-15 DIAGNOSIS — N6313 Unspecified lump in the right breast, lower outer quadrant: Secondary | ICD-10-CM

## 2015-07-15 DIAGNOSIS — N63 Unspecified lump in breast: Secondary | ICD-10-CM | POA: Diagnosis not present

## 2015-07-15 DIAGNOSIS — S39012A Strain of muscle, fascia and tendon of lower back, initial encounter: Secondary | ICD-10-CM | POA: Diagnosis not present

## 2015-07-15 DIAGNOSIS — Y9289 Other specified places as the place of occurrence of the external cause: Secondary | ICD-10-CM | POA: Insufficient documentation

## 2015-07-15 DIAGNOSIS — Z79899 Other long term (current) drug therapy: Secondary | ICD-10-CM | POA: Diagnosis not present

## 2015-07-15 DIAGNOSIS — Z720 Tobacco use: Secondary | ICD-10-CM | POA: Diagnosis not present

## 2015-07-15 MED ORDER — CYCLOBENZAPRINE HCL 10 MG PO TABS: ORAL_TABLET | ORAL | Status: DC

## 2015-07-15 NOTE — ED Notes (Signed)
Developed right lower back pain yesterday . Denies any recent injury but did fall about 2 months ago. Also denies any urinary sx's  And states pain does not radiates

## 2015-07-15 NOTE — ED Notes (Signed)
Pt to ed with c/o lower back pain since last night.  Pt denies injury

## 2015-07-15 NOTE — ED Provider Notes (Signed)
CSN: [id number]     Arrival date & time 07/15/15  1325 History   First MD Initiated Contact with Patient 07/15/15 1417     Chief Complaint  Patient presents with  . Back Pain    HPI Comments: 59 year old female presents today complaining of lower back pain mainly on the right that began yesterday. She fell a couple months ago but has not had an injury since then. No leg pain, tingling or numbness down her legs. Has not taken anything over the counter.  Pt is also concerned about lump she noticed in her right breast 2 days ago on self breast exam. Pt has a history of right sided breast cancer that was treated at the [facility] here at [facility] by Dr. Inez Pilgrim. She has not been seen by them in over a year. She had a breast MRI in November of 2015 which was normal.   Patient is a 60 y.o. female presenting with back pain. The history is provided by the patient.  Back Pain Location:  Lumbar spine Quality:  Aching Radiates to:  Does not radiate Pain severity:  Moderate Pain is:  Same all the time Onset quality:  Sudden Duration:  48 hours Timing:  Constant Progression:  Unchanged Chronicity:  New Relieved by:  None tried Worsened by:  Movement, standing and ambulation Associated symptoms: no bladder incontinence, no bowel incontinence, no leg pain, no numbness, no paresthesias, no perianal numbness, no tingling and no weakness     Past Medical History  Diagnosis Date  . Hypertension   . Cancer 2014    rt breast ductal carcinoma in situ  . Breast lump 2014   Past Surgical History  Procedure Laterality Date  . Tubal ligation    . Spine surgery    . Breast surgery Right 05-16-2013    lumpectomy   Family History  Problem Relation Age of Onset  . Cancer Maternal Aunt     breast  . Cancer Maternal Aunt     breast   Social History  Substance Use Topics  . Smoking status: Current Every Day Smoker -- 5.00 packs/day for 20 years  . Smokeless tobacco: Never Used  . Alcohol Use: Yes    OB History    Gravida Para Term Preterm AB TAB SAB Ectopic Multiple Living   9         8      Obstetric Comments   1st Menstrual Cycle:  12 1st Pregnancy:  16     Review of Systems  Gastrointestinal: Negative for bowel incontinence.  Genitourinary: Negative for bladder incontinence.  Musculoskeletal: Positive for myalgias, back pain and arthralgias.  Neurological: Negative for tingling, weakness, numbness and paresthesias.  All other systems reviewed and are negative.     Allergies  Motrin  Home Medications   Prior to Admission medications   Medication Sig Start Date End Date Taking? Authorizing Provider  amLODipine (NORVASC) 5 MG tablet Take 5 mg by mouth daily.    Historical Provider, MD  cyclobenzaprine (FLEXERIL) 10 MG tablet 1 tab QHS 07/15/15   Corliss Parish, PA-C  hydrochlorothiazide (HYDRODIURIL) 25 MG tablet Take 25 mg by mouth daily.    Historical Provider, MD  tamoxifen (NOLVADEX) 20 MG tablet Take 20 mg by mouth daily.    Historical Provider, MD  traMADol (ULTRAM) 50 MG tablet Take 1 tablet (50 mg total) by mouth 3 (three) times daily as needed for pain. 08/01/13   Seeplaputhur Robinette Haines, MD  BP 173/101 mmHg  Pulse 94  Temp(Src) 98.3 F (36.8 C) (Oral)  Resp 16  Ht 5\' 7"  (1.702 m)  Wt 135 lb (61.236 kg)  BMI 21.14 kg/m2  SpO2 97% Physical Exam  Constitutional: She is oriented to person, place, and time. Vital signs are normal. She appears well-developed and well-nourished. She is active.  Non-toxic appearance. She does not have a sickly appearance. She does not appear ill.  HENT:  Head: Normocephalic and atraumatic.  Pulmonary/Chest: Right breast exhibits mass and tenderness.  No supraclavicular or axillary lymphadenopathy There is a firm right breast mass at approximately 7 o'clock. There is anterior deformity consistent with prior lumpectomy  Left breast is normal  Musculoskeletal: She exhibits tenderness.       Lumbar back: She exhibits tenderness.  She exhibits normal range of motion.  Right lumbar paraspinal muscle tenderness   Neurological: She is alert and oriented to person, place, and time.  Equal LE strength 5/5 Equal great toe raise bilaterally Negative FABER and SLR bilaterally   Skin: Skin is warm and dry.  Psychiatric: She has a normal mood and affect. Her behavior is normal. Judgment and thought content normal.  Nursing note and vitals reviewed.   ED Course  Procedures (including critical care time) Labs Review Labs Reviewed - No data to display  Imaging Review Dg Lumbar Spine 2-3 Views  07/15/2015   CLINICAL DATA:  Fall 2 weeks ago.  RIGHT-sided back pain.  EXAM: LUMBAR SPINE - 2-3 VIEW  COMPARISON:  12/07/2012.  FINDINGS: 5 lumbar type vertebral bodies are present. Degenerative endplate changes are present at L2-L3. These appear similar to the prior exam from 2014. Negative for compression fracture. Lumbar spinal alignment is within normal limits. Renal calculi are present on the RIGHT, with the largest measuring 4 mm. No calculi along the course of the RIGHT ureter. Lumbosacral junction degenerative disc disease is present with disc space narrowing and osteophytes.  IMPRESSION: 1. No acute osseous abnormality. 2. Mild lumbar spondylosis. 3. RIGHT renal calculi.   Electronically Signed   By: Dereck Ligas M.D.   On: 07/15/2015 15:11   I have personally reviewed and evaluated these images and lab results as part of my medical decision-making.   EKG Interpretation None      MDM  I was unable to contact patient's primary care physician, Dr. Rebecka Apley and the patient reports his office is closed. I spoke with the [place] and was able to set up an appointment tomorrow at 8:30am with their new oncologist, Dr. Chalmers Cater. Pt was informed of appointment and is able to go.   Back pain is likely strain - XRAY is normal. Flexeril as needed for muscle spasm, tylenol over the counter  Final diagnoses:  Lumbar strain, initial  encounter  Renal stone  Breast lump on right side at 7 o'clock position        Corliss Parish, PA-C 07/15/15 1552  Delman Kitten, MD 07/16/15 2126

## 2015-07-16 ENCOUNTER — Inpatient Hospital Stay: Payer: Medicaid Other | Attending: Internal Medicine | Admitting: Internal Medicine

## 2015-07-16 ENCOUNTER — Encounter: Payer: Self-pay | Admitting: *Deleted

## 2015-07-23 NOTE — Progress Notes (Signed)
Patient was referred by ED physician to [facility] for new breast mass. She has a history of breast cancer.  Patient missed appointment.  Patient saw Dr. Jamal Collin in 2014 for breast cancer.  Notified Dr. Jennette Kettle office of need for referral to Dr. Jamal Collin for consultation on breast mass.  Left message for Pam, Dr. Jennette Kettle nurse.  Notified patient on 07/16/15 that this referral would be made.

## 2015-07-23 NOTE — Progress Notes (Signed)
Subjective:     Patient ID: Tricia Wilson, female   DOB: 1956/06/05, 59 y.o.   MRN: [id number]  HPI   Review of Systems     Objective:   Physical Exam     Assessment:     n/a     Plan:     Referral to be made to Dr. Jamal Collin by Dr. Lavera Guise.

## 2015-08-05 ENCOUNTER — Ambulatory Visit: Payer: Medicaid Other | Admitting: General Surgery

## 2015-08-12 ENCOUNTER — Other Ambulatory Visit: Payer: Medicaid Other

## 2015-08-12 ENCOUNTER — Ambulatory Visit (INDEPENDENT_AMBULATORY_CARE_PROVIDER_SITE_OTHER): Payer: Medicaid Other | Admitting: General Surgery

## 2015-08-12 ENCOUNTER — Other Ambulatory Visit: Payer: Medicaid Other | Admitting: General Surgery

## 2015-08-12 ENCOUNTER — Telehealth: Payer: Self-pay

## 2015-08-12 ENCOUNTER — Encounter: Payer: Self-pay | Admitting: General Surgery

## 2015-08-12 VITALS — BP 132/82 | HR 90 | Resp 14 | Ht 67.0 in | Wt 137.0 lb

## 2015-08-12 DIAGNOSIS — D0511 Intraductal carcinoma in situ of right breast: Secondary | ICD-10-CM | POA: Diagnosis not present

## 2015-08-12 DIAGNOSIS — N63 Unspecified lump in breast: Secondary | ICD-10-CM

## 2015-08-12 DIAGNOSIS — N6310 Unspecified lump in the right breast, unspecified quadrant: Secondary | ICD-10-CM

## 2015-08-12 HISTORY — PX: BREAST BIOPSY: SHX20

## 2015-08-12 NOTE — Patient Instructions (Addendum)
Breast Biopsy, Care After Refer to this sheet in the next few weeks. These instructions provide you with information on caring for yourself after your procedure. Your caregiver may also give you more specific instructions. Your treatment has been planned according to current medical practices, but problems sometimes occur. Call your caregiver if you have any problems or questions after your procedure. HOME CARE INSTRUCTIONS   Only take over-the-counter or prescription medicines for pain, discomfort, or fever as directed by your caregiver.  Do not take aspirin. It can cause bleeding.  Keep stitches dry when bathing.  Protect the biopsy area. Do not let the area get bumped.  Avoid activities that may pull the incision site open until approved by your caregiver. This can include stretching, reaching, exercise, sports, or lifting over 3 pounds.  Resume your usual diet.  Wear a good support bra for as long as directed by your caregiver.  Change any bandages (dressings) as directed by your caregiver.  Do not drink alcohol while taking pain medicine.  Keep all your follow-up appointments with your caregiver. Ask when your test results will be ready. Make sure you get your test results. SEEK MEDICAL CARE IF:   You have redness, swelling, or increasing pain in the biopsy site.  You have a bad smell coming from the biopsy site or dressing.  Your biopsy site breaks open after the stitches (sutures), staples, or skin adhesive strips have been removed.  You have a rash.  You need stronger medicine. SEEK IMMEDIATE MEDICAL CARE IF:   You have a fever.  You have increased bleeding (more than a small spot) from the biopsy site.  You have difficulty breathing.  You have pus coming from the biopsy site. MAKE SURE YOU:  Understand these instructions.  Will watch your condition.  Will get help right away if you are not doing well or get worse.   This information is not intended to  replace advice given to you by your health care provider. Make sure you discuss any questions you have with your health care provider.   Document Released: 04/23/2005 Document Revised: 10/25/2014 Document Reviewed: 11/04/2011 Elsevier Interactive Patient Education 2016 [place] to be scheduled for the end of this week.  We will call with your results.

## 2015-08-12 NOTE — Telephone Encounter (Signed)
Spoke with patient about having a mammogram completed. Patient is scheduled for a mammogram at [facility] on 08/18/15 at 3:20 pm. She is aware of date and time.

## 2015-08-12 NOTE — Progress Notes (Addendum)
Patient ID: Tricia Wilson, female   DOB: [birth year], 59 y.o.   MRN: [id number]  Chief Complaint  Patient presents with  . Other    breast lump    HPI Tricia Wilson is a 59 y.o. female here today for a evaluation of a right breast lump. Patient noticed this about three weeks ago. She states she has been having some pain at night in that location. Patient had a MRI done 08/28/14.  In 2014 she had lumpectomy for DCIS in right breast and completed radiation. She was stated on Tamoxifen then. She was last seen here in end of 2014. Since then pt had gone back to [place] to help with her ailing father. Sometime thereafter she stopped taking Tamoxifen because she thought she ws getting constipated.  She did follow up with Dr. Lavera Guise 1 yr ago and had breast MRI that as normal. Has not had any mammograms since 2 yrs ago HPI  Past Medical History  Diagnosis Date  . Hypertension   . 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 Breast lump 2014    Past Surgical History  Procedure Laterality Date  . Tubal ligation    . Spine surgery    . Breast surgery Right 05-16-2013    lumpectomy  . Wisdom tooth extraction      Family History  Problem Relation Age of Onset  . Cancer Paternal Aunt 62    small bowel  . Breast cancer Maternal Aunt 54  . Breast cancer Mother   . Breast cancer Sister     younger than age 83  . Breast cancer Sister     younger than age 66  . Breast cancer Maternal Aunt 25  . Breast cancer Cousin 24  . Prostate cancer Brother 2    Social History Social History  Substance Use Topics  . Smoking status: Current Every Day Smoker -- 5.00 packs/day for 20 years    Types: Cigarettes  . Smokeless tobacco: Never Used  . Alcohol Use: 0.0 oz/week    0 Standard drinks or equivalent per week     Comment: "occassional alcohol use"    Allergies  Allergen Reactions  . Motrin [Ibuprofen]     Current Outpatient Prescriptions    Medication Sig Dispense Refill  . amLODipine (NORVASC) 5 MG tablet Take 5 mg by mouth daily.    . hydrochlorothiazide (HYDRODIURIL) 25 MG tablet Take 25 mg by mouth daily.    . cyclobenzaprine (FLEXERIL) 10 MG tablet 1 tab QHS (Patient not taking: Reported on 08/12/2015) 30 tablet 0  . traMADol (ULTRAM) 50 MG tablet Take 1 tablet (50 mg total) by mouth 3 (three) times daily as needed for pain. (Patient not taking: Reported on 08/12/2015) 30 tablet 0   No current facility-administered medications for this visit.    Review of Systems Review of Systems  Constitutional: Negative.   Respiratory: Negative.   Cardiovascular: Negative.     Blood pressure 132/82, pulse 90, resp. rate 14, height 5\' 7"  (1.702 m), weight 137 lb (62.143 kg).  Physical Exam Physical Exam  Constitutional: She is oriented to person, place, and time. She appears well-developed and well-nourished.  Eyes: Conjunctivae are normal. No scleral icterus.  Neck: Neck supple.  Cardiovascular: Normal rate, regular rhythm and normal heart sounds.   Pulmonary/Chest: Effort normal and breath sounds normal. Right breast exhibits mass and skin change. Right breast exhibits no inverted nipple, no  nipple discharge and no tenderness. Left breast exhibits no inverted nipple, no mass, no nipple discharge, no skin change and no tenderness.    Abdominal: Soft. Bowel sounds are normal. There is no tenderness.  Lymphadenopathy:    She has no cervical adenopathy.    She has no axillary adenopathy.  Neurological: She is alert and oriented to person, place, and time.  Skin: Skin is warm and dry.    Data Reviewed Notes and last year MRI reviewed [place] of right breast mass-spiculated hypoechoic mass highly suspicious.  Assessment    Highly suspicious mass rgiht breast. Core biopsy and imaging recommended    PlanPt advised fully on findings and recommendation    Core biopsy of right breast mass completed with consent, clip  placed. Scheduled bilateral diagnostic mammogram.     PCP:  Gretta Arab 08/12/2015, 1:56 PM

## 2015-08-14 ENCOUNTER — Telehealth: Payer: Self-pay | Admitting: General Surgery

## 2015-08-14 NOTE — Telephone Encounter (Signed)
Lwft message for pt to call. Regarding path report.

## 2015-08-18 ENCOUNTER — Other Ambulatory Visit: Payer: Self-pay | Admitting: General Surgery

## 2015-08-18 ENCOUNTER — Ambulatory Visit
Admission: RE | Admit: 2015-08-18 | Discharge: 2015-08-18 | Disposition: A | Discharge status: Home / Self Care | Payer: Medicaid Other | Source: Ambulatory Visit | Attending: General Surgery | Admitting: General Surgery

## 2015-08-18 DIAGNOSIS — N6310 Unspecified lump in the right breast, unspecified quadrant: Secondary | ICD-10-CM

## 2015-08-18 DIAGNOSIS — N63 Unspecified lump in breast: Secondary | ICD-10-CM | POA: Diagnosis not present

## 2015-08-18 HISTORY — DX: Malignant neoplasm of unspecified site of unspecified female breast: C50.919

## 2015-08-19 ENCOUNTER — Ambulatory Visit (INDEPENDENT_AMBULATORY_CARE_PROVIDER_SITE_OTHER): Payer: Medicaid Other | Admitting: General Surgery

## 2015-08-19 ENCOUNTER — Encounter: Payer: Self-pay | Admitting: General Surgery

## 2015-08-19 VITALS — BP 158/88 | HR 70 | Resp 12 | Ht 60.0 in | Wt 140.0 lb

## 2015-08-19 DIAGNOSIS — C50411 Malignant neoplasm of upper-outer quadrant of right female breast: Secondary | ICD-10-CM | POA: Diagnosis not present

## 2015-08-19 DIAGNOSIS — D0511 Intraductal carcinoma in situ of right breast: Secondary | ICD-10-CM | POA: Diagnosis not present

## 2015-08-19 NOTE — Patient Instructions (Addendum)
The patient is aware to call back for any questions or concerns.   The patient is scheduled for surgery at [facility] on 08/29/15. She will pre admit at the hospital. Patient is aware of date and instructions.

## 2015-08-19 NOTE — Progress Notes (Signed)
Patient ID: Tricia Wilson, female   DOB: 04/09/1956, 59 y.o.   MRN: [id number]  Chief Complaint  Patient presents with  . Follow-up    mammogram    HPI Tricia Wilson is a 58 y.o. female who presents for a discussion of pathology on right breast mass form 08-12-15. She states the right breast is still tender. She had a diagnostic mammogram yesterday. I have reviewed the history of present illness with the patient. HPI  Past Medical History  Diagnosis Date  . Hypertension   . 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 Breast lump 2014  . Breast cancer [facility]) 2013    Right breast cancer - Radiation    Past Surgical History  Procedure Laterality Date  . Tubal ligation    . Spine surgery    . Wisdom tooth extraction    . Breast surgery Right 05-16-2013    lumpectomy  . Breast biopsy Right 08/12/2015    US guided biopsy w/ clip placement/SPINDLE CELL PROLIFERATION WITH CYTOLOGIC ATYPIA    Family History  Problem Relation Age of Onset  . Cancer Paternal Aunt 34    small bowel  . Breast cancer Maternal Aunt 63  . Breast cancer Mother   . Breast cancer Sister     younger than age 52  . Breast cancer Sister     younger than age 50  . Breast cancer Maternal Aunt 104  . Breast cancer Cousin 88  . Prostate cancer Brother 46    Social History Social History  Substance Use Topics  . Smoking status: Current Every Day Smoker -- 5.00 packs/day for 20 years    Types: Cigarettes  . Smokeless tobacco: Never Used  . Alcohol Use: 0.0 oz/week    0 Standard drinks or equivalent per week     Comment: "occassional alcohol use"    Allergies  Allergen Reactions  . Motrin [Ibuprofen]     Current Outpatient Prescriptions  Medication Sig Dispense Refill  . amLODipine (NORVASC) 5 MG tablet Take 5 mg by mouth daily.    . cyclobenzaprine (FLEXERIL) 10 MG tablet 1 tab QHS 30 tablet 0  . hydrochlorothiazide (HYDRODIURIL) 25 MG tablet Take 25 mg  by mouth daily.    . traMADol (ULTRAM) 50 MG tablet Take 1 tablet (50 mg total) by mouth 3 (three) times daily as needed for pain. 30 tablet 0   No current facility-administered medications for this visit.    Review of Systems Review of Systems  Constitutional: Negative.   Respiratory: Negative.   Cardiovascular: Negative.   Neurological: Positive for headaches.    Blood pressure 158/88, pulse 70, resp. rate 12, height 5' (1.524 m), weight 140 lb (63.504 kg).  Physical Exam Physical Exam  Constitutional: She is oriented to person, place, and time. She appears well-developed and well-nourished.  HENT:  Mouth/Throat: Oropharynx is clear and moist.  Eyes: Conjunctivae are normal. No scleral icterus.  Neck: Neck supple.  Cardiovascular: Normal rate, regular rhythm and normal heart sounds.   Pulmonary/Chest: Effort normal and breath sounds normal.  Breast exam basically unchanged, palpable mass and skin dimpling on lateral right breast.   Lymphadenopathy:    She has no cervical adenopathy.    She has no axillary adenopathy.  Neurological: She is alert and oriented to person, place, and time.  Skin: Skin is warm and dry.  Psychiatric: Her behavior is normal.    Data Reviewed  Mammogram and ultrasound. Right breast mass noted as on exam. Left breast had questionable asymmetry medial location seen only on CC view. Ultrasound of this area was normal.  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 She needs wide excision of the right breast mass. Further treatment will be based on final pathology report.  Schedule right breast lumpectomy. Patient is agreeable to plan, and risks and benefits have been discussed.   The patient is scheduled for surgery at [facility] on 08/29/15. She will pre admit at the hospital. Patient is aware of date and instructions.       PCP:  Maso SANKAR,SEEPLAPUTHUR G 08/19/2015,  10:55 AM

## 2015-08-22 ENCOUNTER — Other Ambulatory Visit: Payer: Self-pay | Admitting: General Surgery

## 2015-08-22 ENCOUNTER — Encounter
Admission: RE | Admit: 2015-08-22 | Discharge: 2015-08-22 | Disposition: A | Discharge status: Home / Self Care | Payer: Medicaid Other | Source: Ambulatory Visit | Attending: General Surgery | Admitting: General Surgery

## 2015-08-22 DIAGNOSIS — N63 Unspecified lump in unspecified breast: Secondary | ICD-10-CM

## 2015-08-22 DIAGNOSIS — Z01812 Encounter for preprocedural laboratory examination: Secondary | ICD-10-CM | POA: Insufficient documentation

## 2015-08-22 DIAGNOSIS — Z01818 Encounter for other preprocedural examination: Secondary | ICD-10-CM | POA: Diagnosis present

## 2015-08-22 LAB — BASIC METABOLIC PANEL
ANION GAP: 8 (ref 5–15)
BUN: 17 mg/dL (ref 6–20)
CALCIUM: 9.8 mg/dL (ref 8.9–10.3)
CO2: 27 mmol/L (ref 22–32)
Chloride: 104 mmol/L (ref 101–111)
Creatinine, Ser: 0.95 mg/dL (ref 0.44–1.00)
Glucose, Bld: 99 mg/dL (ref 65–99)
Potassium: 4 mmol/L (ref 3.5–5.1)
SODIUM: 139 mmol/L (ref 135–145)

## 2015-08-22 NOTE — Pre-Procedure Instructions (Signed)
Tricia Wilson here to spanish interpret.

## 2015-08-22 NOTE — Patient Instructions (Signed)
  Your procedure is scheduled on: Friday Nov. 11, 2016. Report to Same Day Surgery. To find out your arrival time please call [phone] between 1PM - 3PM on Thursday Nov. 10, 2016  Remember: Instructions that are not followed completely may result in serious medical risk, up to and including death, or upon the discretion of your surgeon and anesthesiologist your surgery may need to be rescheduled.    _x___ 1. Do not eat food or drink liquids after midnight. No gum chewing or hard candies.     _x___ 2. No Alcohol for 24 hours before or after surgery.   ____ 3. Bring all medications with you on the day of surgery if instructed.    __x__ 4. Notify your doctor if there is any change in your medical condition     (cold, fever, infections).     Do not wear jewelry, make-up, hairpins, clips or nail polish.  Do not wear lotions, powders, or perfumes. You may wear deodorant.  Do not shave 48 hours prior to surgery. Men may shave face and neck.  Do not bring valuables to the hospital.    [facility] is not responsible for any belongings or valuables.               Contacts, dentures or bridgework may not be worn into surgery.  Leave your suitcase in the car. After surgery it may be brought to your room.  For patients admitted to the hospital, discharge time is determined by your treatment team.   Patients discharged the day of surgery will not be allowed to drive home.    Please read over the following fact sheets that you were given:   [facility] Preparing for Surgery  _x___ Take these medicines the morning of surgery with A SIP OF WATER:    1. amLODipine (NORVASC)  2. omeprazole (PRILOSEC)    ____ Fleet Enema (as directed)   _x___ Use CHG Soap as directed  ____ Use inhalers on the day of surgery  ____ Stop metformin 2 days prior to surgery    ____ Take 1/2 of usual insulin dose the night before surgery and none on the morning of surgery.   ____ Stop Coumadin/Plavix/aspirin  on does not apply.  _s___ Stop Anti-inflammatories such as ibuprofen, aspirin, aleve. Tylenol is ok to take for pain.   ____ Stop supplements until after surgery.    ____ Bring C-Pap to the hospital.

## 2015-08-26 LAB — SLIDE CONSULT, PATHOLOGY ARMC

## 2015-08-29 ENCOUNTER — Ambulatory Visit: Payer: Medicaid Other | Admitting: Anesthesiology

## 2015-08-29 ENCOUNTER — Encounter
Admission: RE | Disposition: A | Discharge status: Home / Self Care | Payer: Self-pay | Source: Ambulatory Visit | Attending: General Surgery

## 2015-08-29 ENCOUNTER — Ambulatory Visit
Admission: RE | Admit: 2015-08-29 | Discharge: 2015-08-29 | Disposition: A | Discharge status: Home / Self Care | Payer: Medicaid Other | Source: Ambulatory Visit | Attending: General Surgery | Admitting: General Surgery

## 2015-08-29 ENCOUNTER — Encounter: Payer: Self-pay | Admitting: *Deleted

## 2015-08-29 DIAGNOSIS — C50919 Malignant neoplasm of unspecified site of unspecified female breast: Secondary | ICD-10-CM | POA: Diagnosis present

## 2015-08-29 DIAGNOSIS — Z8042 Family history of malignant neoplasm of prostate: Secondary | ICD-10-CM | POA: Diagnosis not present

## 2015-08-29 DIAGNOSIS — Z79899 Other long term (current) drug therapy: Secondary | ICD-10-CM | POA: Diagnosis not present

## 2015-08-29 DIAGNOSIS — F17210 Nicotine dependence, cigarettes, uncomplicated: Secondary | ICD-10-CM | POA: Diagnosis not present

## 2015-08-29 DIAGNOSIS — C50911 Malignant neoplasm of unspecified site of right female breast: Secondary | ICD-10-CM | POA: Diagnosis not present

## 2015-08-29 DIAGNOSIS — I10 Essential (primary) hypertension: Secondary | ICD-10-CM | POA: Insufficient documentation

## 2015-08-29 DIAGNOSIS — Z803 Family history of malignant neoplasm of breast: Secondary | ICD-10-CM | POA: Insufficient documentation

## 2015-08-29 DIAGNOSIS — D0591 Unspecified type of carcinoma in situ of right breast: Secondary | ICD-10-CM | POA: Insufficient documentation

## 2015-08-29 DIAGNOSIS — Z853 Personal history of malignant neoplasm of breast: Secondary | ICD-10-CM | POA: Diagnosis not present

## 2015-08-29 DIAGNOSIS — C50411 Malignant neoplasm of upper-outer quadrant of right female breast: Secondary | ICD-10-CM

## 2015-08-29 HISTORY — PX: BREAST LUMPECTOMY: SHX2

## 2015-08-29 SURGERY — BREAST LUMPECTOMY
Anesthesia: General | Laterality: Right | Wound class: Clean

## 2015-08-29 MED ORDER — FAMOTIDINE 20 MG PO TABS
ORAL_TABLET | ORAL | Status: AC
  Filled 2015-08-29: qty 1

## 2015-08-29 MED ORDER — HYDROCODONE-ACETAMINOPHEN 5-300 MG PO TABS: 1.0000 | ORAL_TABLET | Freq: Four times a day (QID) | ORAL | Status: DC | PRN

## 2015-08-29 MED ORDER — FENTANYL CITRATE (PF) 100 MCG/2ML IJ SOLN
INTRAMUSCULAR | Status: DC | PRN
  Administered 2015-08-29 (×2): 50 ug via INTRAVENOUS

## 2015-08-29 MED ORDER — FENTANYL CITRATE (PF) 100 MCG/2ML IJ SOLN
25.0000 ug | INTRAMUSCULAR | Status: DC | PRN
  Administered 2015-08-29 (×2): 25 ug via INTRAVENOUS

## 2015-08-29 MED ORDER — BUPIVACAINE HCL (PF) 0.5 % IJ SOLN
INTRAMUSCULAR | Status: DC | PRN
  Administered 2015-08-29: 20 mL

## 2015-08-29 MED ORDER — PHENYLEPHRINE HCL 10 MG/ML IJ SOLN
INTRAMUSCULAR | Status: DC | PRN
  Administered 2015-08-29 (×2): 100 ug via INTRAVENOUS

## 2015-08-29 MED ORDER — CEFAZOLIN SODIUM-DEXTROSE 2-3 GM-% IV SOLR
2.0000 g | INTRAVENOUS | Status: AC
  Administered 2015-08-29: 2 g via INTRAVENOUS

## 2015-08-29 MED ORDER — CEFAZOLIN SODIUM-DEXTROSE 2-3 GM-% IV SOLR
INTRAVENOUS | Status: AC
Start: 2015-08-29 — End: 2015-08-29
  Administered 2015-08-29: 2 g via INTRAVENOUS
  Filled 2015-08-29: qty 50

## 2015-08-29 MED ORDER — LIDOCAINE HCL (CARDIAC) 20 MG/ML IV SOLN
INTRAVENOUS | Status: DC | PRN
  Administered 2015-08-29: 60 mg via INTRAVENOUS

## 2015-08-29 MED ORDER — PROPOFOL 10 MG/ML IV BOLUS
INTRAVENOUS | Status: DC | PRN
  Administered 2015-08-29: 130 mg via INTRAVENOUS

## 2015-08-29 MED ORDER — ONDANSETRON HCL 4 MG/2ML IJ SOLN: 4.0000 mg | Freq: Once | INTRAMUSCULAR | Status: DC | PRN

## 2015-08-29 MED ORDER — EPHEDRINE SULFATE 50 MG/ML IJ SOLN
INTRAMUSCULAR | Status: DC | PRN
  Administered 2015-08-29 (×2): 10 mg via INTRAVENOUS

## 2015-08-29 MED ORDER — FENTANYL CITRATE (PF) 100 MCG/2ML IJ SOLN
INTRAMUSCULAR | Status: AC
  Administered 2015-08-29: 25 ug via INTRAVENOUS
  Filled 2015-08-29: qty 2

## 2015-08-29 MED ORDER — FAMOTIDINE 20 MG PO TABS
20.0000 mg | ORAL_TABLET | Freq: Once | ORAL | Status: AC
  Administered 2015-08-29: 20 mg via ORAL

## 2015-08-29 MED ORDER — DEXAMETHASONE SODIUM PHOSPHATE 4 MG/ML IJ SOLN
INTRAMUSCULAR | Status: DC | PRN
  Administered 2015-08-29: 10 mg via INTRAVENOUS

## 2015-08-29 MED ORDER — LACTATED RINGERS IV SOLN
INTRAVENOUS | Status: DC
  Administered 2015-08-29: 08:00:00 via INTRAVENOUS

## 2015-08-29 MED ORDER — MIDAZOLAM HCL 2 MG/2ML IJ SOLN
INTRAMUSCULAR | Status: DC | PRN
  Administered 2015-08-29: 2 mg via INTRAVENOUS

## 2015-08-29 MED ORDER — CHLORHEXIDINE GLUCONATE 4 % EX LIQD: 1.0000 "application " | Freq: Once | CUTANEOUS | Status: DC

## 2015-08-29 MED ORDER — ONDANSETRON HCL 4 MG/2ML IJ SOLN
INTRAMUSCULAR | Status: DC | PRN
  Administered 2015-08-29: 4 mg via INTRAVENOUS

## 2015-08-29 MED ORDER — BUPIVACAINE HCL (PF) 0.5 % IJ SOLN
INTRAMUSCULAR | Status: AC
  Filled 2015-08-29: qty 30

## 2015-08-29 SURGICAL SUPPLY — 34 items
BLADE SURG 15 STRL SS SAFETY (BLADE) ×6 IMPLANT
BULB RESERV EVAC DRAIN JP 100C (MISCELLANEOUS) IMPLANT
CANISTER SUCT 1200ML W/VALVE (MISCELLANEOUS) ×3 IMPLANT
CHLORAPREP W/TINT 26ML (MISCELLANEOUS) ×3 IMPLANT
CLOSURE WOUND 1/2 X4 (GAUZE/BANDAGES/DRESSINGS) ×1
CNTNR SPEC 2.5X3XGRAD LEK (MISCELLANEOUS) ×1
CONT SPEC 4OZ STER OR WHT (MISCELLANEOUS) ×2
CONTAINER SPEC 2.5X3XGRAD LEK (MISCELLANEOUS) ×1 IMPLANT
COVER PROBE FLX POLY STRL (MISCELLANEOUS) ×3 IMPLANT
DEVICE LOCALIZATION ULTRAWIRE (WIRE) IMPLANT
DRAIN CHANNEL JP 15F RND 16 (MISCELLANEOUS) IMPLANT
DRAPE LAPAROTOMY TRNSV 106X77 (MISCELLANEOUS) ×3 IMPLANT
GLOVE BIO SURGEON STRL SZ7 (GLOVE) ×15 IMPLANT
GOWN STRL REUS W/ TWL LRG LVL3 (GOWN DISPOSABLE) ×3 IMPLANT
GOWN STRL REUS W/TWL LRG LVL3 (GOWN DISPOSABLE) ×6
HARMONIC SCALPEL FOCUS (MISCELLANEOUS) IMPLANT
KIT RM TURNOVER STRD PROC AR (KITS) ×3 IMPLANT
LABEL OR SOLS (LABEL) ×3 IMPLANT
LIQUID BAND (GAUZE/BANDAGES/DRESSINGS) ×3 IMPLANT
MARGIN MAP 10MM (MISCELLANEOUS) ×3 IMPLANT
NDL SAFETY 22GX1.5 (NEEDLE) ×3 IMPLANT
NEEDLE HYPO 25X1 1.5 SAFETY (NEEDLE) ×3 IMPLANT
PACK BASIN MINOR ARMC (MISCELLANEOUS) ×3 IMPLANT
PAD GROUND ADULT SPLIT (MISCELLANEOUS) ×3 IMPLANT
STRIP CLOSURE SKIN 1/2X4 (GAUZE/BANDAGES/DRESSINGS) ×2 IMPLANT
SUT ETH BLK MONO 3 0 FS 1 12/B (SUTURE) ×3 IMPLANT
SUT MNCRL AB 3-0 PS2 27 (SUTURE) IMPLANT
SUT VIC AB 2-0 BRD 54 (SUTURE) IMPLANT
SUT VIC AB 2-0 CT2 27 (SUTURE) ×3 IMPLANT
SUT VIC AB 3-0 SH 27 (SUTURE) ×2
SUT VIC AB 3-0 SH 27X BRD (SUTURE) ×1 IMPLANT
SYRINGE 10CC LL (SYRINGE) ×3 IMPLANT
ULTRAWIRE LOCALIZATION DEVICE (WIRE)
WATER STERILE IRR 1000ML POUR (IV SOLUTION) ×3 IMPLANT

## 2015-08-29 NOTE — Interval H&P Note (Signed)
History and Physical Interval Note:  08/29/2015 8:41 AM  Tricia Wilson  has presented today for surgery, with the diagnosis of RIGHT BREAST NEOPLASM  The various methods of treatment have been discussed with the patient and family. After consideration of risks, benefits and other options for treatment, the patient has consented to  Procedure(s): BREAST LUMPECTOMY (Right) as a surgical intervention .  The patient's history has been reviewed, patient examined, no change in status, stable for surgery.  I have reviewed the patient's chart and labs.  Questions were answered to the patient's satisfaction.     [name]

## 2015-08-29 NOTE — H&P (View-Only) (Signed)
Patient ID: Tricia Wilson, female   DOB: 07/21/1956, 59 y.o.   MRN: [id number]  Chief Complaint  Patient presents with  . Follow-up    mammogram    HPI Micah R Cerullo is a 59 y.o. female who presents for a discussion of pathology on right breast mass form 08-12-15. She states the right breast is still tender. She had a diagnostic mammogram yesterday. I have reviewed the history of present illness with the patient. HPI  Past Medical History  Diagnosis Date  . Hypertension   . 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 Breast lump 2014  . Breast cancer ([facility]) 2013    Right breast cancer - Radiation    Past Surgical History  Procedure Laterality Date  . Tubal ligation    . Spine surgery    . Wisdom tooth extraction    . Breast surgery Right 05-16-2013    lumpectomy  . Breast biopsy Right 08/12/2015    US guided biopsy w/ clip placement/SPINDLE CELL PROLIFERATION WITH CYTOLOGIC ATYPIA    Family History  Problem Relation Age of Onset  . Cancer Paternal Aunt 38    small bowel  . Breast cancer Maternal Aunt 45  . Breast cancer Mother   . Breast cancer Sister     younger than age 50  . Breast cancer Sister     younger than age 50  . Breast cancer Maternal Aunt 50  . Breast cancer Cousin 38  . Prostate cancer Brother 40    Social History Social History  Substance Use Topics  . Smoking status: Current Every Day Smoker -- 5.00 packs/day for 20 years    Types: Cigarettes  . Smokeless tobacco: Never Used  . Alcohol Use: 0.0 oz/week    0 Standard drinks or equivalent per week     Comment: "occassional alcohol use"    Allergies  Allergen Reactions  . Motrin [Ibuprofen]     Current Outpatient Prescriptions  Medication Sig Dispense Refill  . amLODipine (NORVASC) 5 MG tablet Take 5 mg by mouth daily.    . cyclobenzaprine (FLEXERIL) 10 MG tablet 1 tab QHS 30 tablet 0  . hydrochlorothiazide (HYDRODIURIL) 25 MG tablet Take 25 mg  by mouth daily.    . traMADol (ULTRAM) 50 MG tablet Take 1 tablet (50 mg total) by mouth 3 (three) times daily as needed for pain. 30 tablet 0   No current facility-administered medications for this visit.    Review of Systems Review of Systems  Constitutional: Negative.   Respiratory: Negative.   Cardiovascular: Negative.   Neurological: Positive for headaches.    Blood pressure 158/88, pulse 70, resp. rate 12, height 5' (1.524 m), weight 140 lb (63.504 kg).  Physical Exam Physical Exam  Constitutional: She is oriented to person, place, and time. She appears well-developed and well-nourished.  HENT:  Mouth/Throat: Oropharynx is clear and moist.  Eyes: Conjunctivae are normal. No scleral icterus.  Neck: Neck supple.  Cardiovascular: Normal rate, regular rhythm and normal heart sounds.   Pulmonary/Chest: Effort normal and breath sounds normal.  Breast exam basically unchanged, palpable mass and skin dimpling on lateral right breast.   Lymphadenopathy:    She has no cervical adenopathy.    She has no axillary adenopathy.  Neurological: She is alert and oriented to person, place, and time.  Skin: Skin is warm and dry.  Psychiatric: Her behavior is normal.    Data Reviewed   Mammogram and ultrasound. Right breast mass noted as on exam. Left breast had questionable asymmetry medial location seen only on CC view. Ultrasound of this area was normal.  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 She needs wide excision of the right breast mass. Further treatment will be based on final pathology report.  Schedule right breast lumpectomy. Patient is agreeable to plan, and risks and benefits have been discussed.   The patient is scheduled for surgery at [facility] on 08/29/15. She will pre admit at the hospital. Patient is aware of date and instructions.       PCP:  Maso SANKAR,SEEPLAPUTHUR G 08/19/2015,  10:55 AM    

## 2015-08-29 NOTE — Discharge Instructions (Signed)
Anestesia general, adultos, cuidados posteriores (General Anesthesia, Adult, Care After) Siga estas instrucciones durante las prximas semanas. Estas indicaciones le proporcionan informacin acerca de cmo deber cuidarse despus del procedimiento. El mdico tambin podr darle instrucciones ms especficas. El tratamiento se ha planificado de acuerdo a las prcticas mdicas actuales, pero a veces se producen problemas. Comunquese con el mdico si tiene algn problema o tiene dudas despus del procedimiento. QU ESPERAR DESPUS DEL PROCEDIMIENTO Despus del procedimiento es habitual experimentar:  Somnolencia.  Nuseas y vmitos. INSTRUCCIONES PARA EL CUIDADO EN EL HOGAR  Durante las primeras 24 horas luego de la anestesia general:  Haga que una persona responsable se quede con usted.  No conduzca un automvil. Si est solo, no viaje en transporte pblico.  No beba alcohol.  No tome medicamentos que no le haya recetado su mdico.  No firme documentos importantes ni tome decisiones trascendentes.  Puede reanudar su dieta y sus actividades normales segn le haya indicado el mdico.  Cambie los vendajes (apsitos) tal como se le indic.  Si tiene preguntas o se le presenta algn problema relacionado con la anestesia general, comunquese con el hospital y pida por el anestesista o anestesilogo de [place]. SOLICITE ATENCIN MDICA SI:  Tiene nuseas y [place] posterior a la anestesia.  Le aparece una erupcin cutnea. SOLICITE ATENCIN MDICA DE INMEDIATO SI:   Tiene dificultad para respirar.  Siente dolor en el pecho.  Tiene algn problema alrgico.   Esta informacin no tiene como fin reemplazar el consejo del mdico. Asegrese de hacerle al mdico cualquier pregunta que tenga.   Document Released: 10/04/2005 Document Revised: 10/25/2014 Elsevier Interactive Patient Education 2016 [place] Anesthesia, Adult, Care After Refer to this sheet in  the next few weeks. These instructions provide you with information on caring for yourself after your procedure. Your health care provider may also give you more specific instructions. Your treatment has been planned according to current medical practices, but problems sometimes occur. Call your health care provider if you have any problems or questions after your procedure. WHAT TO EXPECT AFTER THE PROCEDURE After the procedure, it is typical to experience:  Sleepiness.  Nausea and vomiting. HOME CARE INSTRUCTIONS  For the first 24 hours after general anesthesia:  Have a responsible person with you.  Do not drive a car. If you are alone, do not take public transportation.  Do not drink alcohol.  Do not take medicine that has not been prescribed by your health care provider.  Do not sign important papers or make important decisions.  You may resume a normal diet and activities as directed by your health care provider.  Change bandages (dressings) as directed.  If you have questions or problems that seem related to general anesthesia, call the hospital and ask for the anesthetist or anesthesiologist on call. SEEK MEDICAL CARE IF:  You have nausea and vomiting that continue the day after anesthesia.  You develop a rash. SEEK IMMEDIATE MEDICAL CARE IF:   You have difficulty breathing.  You have chest pain.  You have any allergic problems.   This information is not intended to replace advice given to you by your health care provider. Make sure you discuss any questions you have with your health care provider.   Document Released: 01/10/2001 Document Revised: 10/25/2014 Document Reviewed: 02/02/2012 Elsevier Interactive Patient Education [employer].

## 2015-08-29 NOTE — Anesthesia Preprocedure Evaluation (Signed)
Anesthesia Evaluation  Patient identified by MRN, date of birth, ID band Patient awake    Reviewed: Allergy & Precautions, NPO status , Patient's Chart, lab work & pertinent test results  Airway Mallampati: II  TM Distance: >3 FB     Dental  (+) Caps   Pulmonary Current Smoker,    Pulmonary exam normal breath sounds clear to auscultation       Cardiovascular hypertension, Pt. on medications Normal cardiovascular exam     Neuro/Psych negative neurological ROS  negative psych ROS   GI/Hepatic GERD  Medicated and Controlled,  Endo/Other  negative endocrine ROS  Renal/GU negative Renal ROS  negative genitourinary   Musculoskeletal negative musculoskeletal ROS (+)   Abdominal Normal abdominal exam  (+)   Peds negative pediatric ROS (+)  Hematology negative hematology ROS (+)   Anesthesia Other Findings Breast CA  Reproductive/Obstetrics                             Anesthesia Physical Anesthesia Plan  ASA: II  Anesthesia Plan: General   Post-op Pain Management:    Induction: Intravenous  Airway Management Planned: LMA  Additional Equipment:   Intra-op Plan:   Post-operative Plan: Extubation in OR  Informed Consent: I have reviewed the patients History and Physical, chart, labs and discussed the procedure including the risks, benefits and alternatives for the proposed anesthesia with the patient or authorized representative who has indicated his/her understanding and acceptance.   Dental advisory given  Plan Discussed with: CRNA and Surgeon  Anesthesia Plan Comments:         Anesthesia Quick Evaluation

## 2015-08-29 NOTE — Op Note (Signed)
Preop diagnosis: Sarcomatous neoplasm right breast  Post op diagnosis: Same  Operation: Right breast lumpectomy  Surgeon: S.G.Thamar Holik  Assistant:     Anesthesia: Gen.  Complications: None  EBL: Minimal  Drains: None  Description: Patient was put to sleep in supine position the operating table the right breast was prepped and draped as sterile field. Timeout was performed. The mass in question was located 9:00 at the peripheral  region of the right breast. It measured 1-2 cm in size and had dimpling of the skin overlying this. An elliptical excision of this was mapped out on the skin. Skin incision was made and skin and subcutaneous tissue was elevated on both sides away from the mass. Direct visualization and palpation wide excision was then completed with the use of cautery. The excised specimen was tagged for margins and sent to pathology. After ensuring hemostasis 20 mL of half percent Marcaine was instilled for postop analgesia. Deeper tissue closed with 2-0 Vicryl subcutaneous tissue 3-0 Vicryl. Skin was closed with subcuticular 3-0 Monocryl covered with liqui  Ban. She was subsequently returned recovery room stable condition

## 2015-08-29 NOTE — Anesthesia Procedure Notes (Signed)
Procedure Name: LMA Insertion Date/Time: 08/29/2015 9:06 AM Performed by: Silvana Newness Pre-anesthesia Checklist: Patient identified, Emergency Drugs available, Suction available, Patient being monitored and Timeout performed Patient Re-evaluated:Patient Re-evaluated prior to inductionOxygen Delivery Method: Circle system utilized Preoxygenation: Pre-oxygenation with 100% oxygen Intubation Type: IV induction Ventilation: Mask ventilation without difficulty LMA: LMA inserted LMA Size: 3.5 Number of attempts: 1 Placement Confirmation: positive ETCO2 and breath sounds checked- equal and bilateral Tube secured with: Tape Dental Injury: Teeth and Oropharynx as per pre-operative assessment

## 2015-08-29 NOTE — Transfer of Care (Signed)
Immediate Anesthesia Transfer of Care Note  Patient: Tricia Wilson  Procedure(s) Performed: Procedure(s): BREAST LUMPECTOMY (Right)  Patient Location: PACU  Anesthesia Type:General  Level of Consciousness: awake, alert , oriented and patient cooperative  Airway & Oxygen Therapy: Patient Spontanous Breathing and Patient connected to face mask oxygen  Post-op Assessment: Report given to RN, Post -op Vital signs reviewed and stable and Patient moving all extremities X 4  Post vital signs: Reviewed and stable  Last Vitals:  Filed Vitals:   08/29/15 1007  BP: 124/87  Pulse: 88  Temp: 36.2 C  Resp: 23    Complications: No apparent anesthesia complications

## 2015-08-30 NOTE — Anesthesia Postprocedure Evaluation (Signed)
  Anesthesia Post-op Note  Patient: Tricia Wilson  Procedure(s) Performed: Procedure(s): BREAST LUMPECTOMY (Right)  Anesthesia type:General  Patient location: PACU  Post pain: Pain level controlled  Post assessment: Post-op Vital signs reviewed, Patient's Cardiovascular Status Stable, Respiratory Function Stable, Patent Airway and No signs of Nausea or vomiting  Post vital signs: Reviewed and stable  Last Vitals:  Filed Vitals:   08/29/15 1106  BP: 139/87  Pulse: 73  Temp: 36.3 C  Resp: 16    Level of consciousness: awake, alert  and patient cooperative  Complications: No apparent anesthesia complications

## 2015-09-04 ENCOUNTER — Ambulatory Visit (INDEPENDENT_AMBULATORY_CARE_PROVIDER_SITE_OTHER): Payer: Medicaid Other | Admitting: General Surgery

## 2015-09-04 ENCOUNTER — Encounter: Payer: Self-pay | Admitting: General Surgery

## 2015-09-04 VITALS — BP 158/94 | HR 88 | Resp 16 | Ht 60.0 in | Wt 139.0 lb

## 2015-09-04 DIAGNOSIS — C50411 Malignant neoplasm of upper-outer quadrant of right female breast: Secondary | ICD-10-CM

## 2015-09-04 NOTE — Patient Instructions (Signed)
Call with any questions, follow up in 2 weeks. No restrictions from surgery.

## 2015-09-04 NOTE — Progress Notes (Signed)
This is a 59 year old female here today for her post op right breast lumpectomy done on 08/29/15. Patient states she had a fever yesterday of 101. I have reviewed the history of present illness with the patient.  Right breast lumpectomy site clean, healing well.   Pathology - malignant tissue - indeterminate type at this time, pending second opinion from outside lab. Margins were clear.   No restrictions, follow up in 2 weeks for re-check. Will update patient if pathology report arrives before then.

## 2015-09-18 ENCOUNTER — Ambulatory Visit: Payer: Medicaid Other | Admitting: General Surgery

## 2015-09-22 ENCOUNTER — Inpatient Hospital Stay: Payer: Medicaid Other | Attending: Oncology | Admitting: Oncology

## 2015-09-24 ENCOUNTER — Ambulatory Visit: Payer: Medicaid Other | Admitting: Oncology

## 2015-10-03 LAB — SURGICAL PATHOLOGY

## 2015-11-13 ENCOUNTER — Encounter: Payer: Self-pay | Admitting: *Deleted

## 2016-01-08 ENCOUNTER — Other Ambulatory Visit: Payer: Self-pay | Admitting: Internal Medicine

## 2016-01-08 DIAGNOSIS — R102 Pelvic and perineal pain: Secondary | ICD-10-CM

## 2016-01-12 ENCOUNTER — Other Ambulatory Visit: Payer: Self-pay | Admitting: Internal Medicine

## 2016-01-12 DIAGNOSIS — R1032 Left lower quadrant pain: Secondary | ICD-10-CM

## 2016-01-13 ENCOUNTER — Ambulatory Visit: Payer: Medicaid Other

## 2016-01-13 ENCOUNTER — Inpatient Hospital Stay: Admission: RE | Admit: 2016-01-13 | Payer: Medicaid Other | Source: Ambulatory Visit

## 2016-01-16 ENCOUNTER — Ambulatory Visit: Payer: Medicaid Other

## 2016-02-03 ENCOUNTER — Ambulatory Visit (INDEPENDENT_AMBULATORY_CARE_PROVIDER_SITE_OTHER): Payer: Medicaid Other | Admitting: Obstetrics and Gynecology

## 2016-02-03 ENCOUNTER — Encounter: Payer: Self-pay | Admitting: Obstetrics and Gynecology

## 2016-02-03 VITALS — BP 175/119 | HR 103 | Ht 60.0 in | Wt 137.4 lb

## 2016-02-03 DIAGNOSIS — N390 Urinary tract infection, site not specified: Secondary | ICD-10-CM | POA: Diagnosis not present

## 2016-02-03 DIAGNOSIS — R102 Pelvic and perineal pain: Secondary | ICD-10-CM

## 2016-02-03 LAB — POCT URINALYSIS DIPSTICK
BILIRUBIN UA: NEGATIVE
GLUCOSE UA: NEGATIVE
KETONES UA: NEGATIVE
Nitrite, UA: POSITIVE
RBC UA: NEGATIVE
SPEC GRAV UA: 1.025
Urobilinogen, UA: 0.2
pH, UA: 6

## 2016-02-03 MED ORDER — NITROFURANTOIN MONOHYD MACRO 100 MG PO CAPS: 100.0000 mg | ORAL_CAPSULE | Freq: Two times a day (BID) | ORAL | Status: DC

## 2016-02-03 NOTE — Progress Notes (Signed)
GYN ENCOUNTER NOTE  Subjective:       Tricia Wilson is a 60 y.o. [phone] female is here for gynecologic evaluation of the following issues:  1.Lower abdominal pain.    Patient notes lower abdominal pelvic pain 4 weeks. She has a generalized discomfort that is associated with nausea and vomiting at least once a day. There are no apparent exacerbating factors. Using factors. Sleeping. No history of fevers/chills/sweats. No history of weight loss. Bowel movements are normal. Patient is experiencing urinary frequency.  Patient has history of breast cancer 2, treated by Dr. Jamal Collin   Gynecologic History No LMP recorded. Patient is postmenopausal. Contraception: post menopausal status Last Pap normal Last mammogram: History of breast cancer, followed by Dr. Jamal Collin  Menarche age 73 Menopause age 85  no severe vasomotor symptoms  Obstetric History OB History  Gravida Para Term Preterm AB SAB TAB Ectopic Multiple Living  8 7 7  0 1 1 0 0 0 7    # Outcome Date GA Lbr Len/2nd Weight Sex Delivery Anes PTL Lv  8 Term 2004    M Vag-Spont   Y  7 Term 1996    M Vag-Spont   Y  6 Term 1989    F Vag-Spont   Y  5 Term 1987    M Vag-Spont   Y  4 Term 1981    [place]  3 SAB 1977          2 Term 1977    M Vag-Spont   Y  1 Term 1973    F Vag-Spont   Y    Obstetric Comments  1st Menstrual Cycle:  12  1st Pregnancy:  16    Past Medical History  Diagnosis Date  . Hypertension   . 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 Breast lump 2014  . Breast cancer [facility]) 2013    Right breast cancer - Radiation    Past Surgical History  Procedure Laterality Date  . Wisdom tooth extraction    . Shoulder arthroscopy Right 1996  . Breast surgery Right 05-16-2013    lumpectomy  . Breast biopsy Right 08/12/2015    US guided biopsy w/ clip placement/SPINDLE CELL PROLIFERATION WITH CYTOLOGIC ATYPIA  . Breast lumpectomy Right 08/29/2015    Procedure:  BREAST LUMPECTOMY;  Surgeon: Christene Lye, MD;  Location: [facility] ORS;  Service: General;  Laterality: Right;  . Tubal ligation  [place]     Current Outpatient Prescriptions on File Prior to Visit  Medication Sig Dispense Refill  . amLODipine (NORVASC) 5 MG tablet Take 5 mg by mouth daily. In am    . cyclobenzaprine (FLEXERIL) 10 MG tablet 1 tab QHS (Patient taking differently: at bedtime. 1 tab QHS) 30 tablet 0  . dicyclomine (BENTYL) 10 MG capsule Take 10 mg by mouth 4 (four) times daily -  before meals and at bedtime. As needed.    . hydrochlorothiazide (HYDRODIURIL) 25 MG tablet Take 25 mg by mouth daily. In am    . Hydrocodone-Acetaminophen (VICODIN) 5-300 MG TABS Take 1 tablet by mouth 4 (four) times daily as needed (moderate pain). 30 each 0  . omeprazole (PRILOSEC) 20 MG capsule Take 20 mg by mouth daily. As needed.    . traMADol (ULTRAM) 50 MG tablet Take 1 tablet (50 mg total) by mouth 3 (three) times daily as needed for pain. [place]  tablet 0   No current facility-administered medications on file prior to visit.    Allergies  Allergen Reactions  . Motrin [Ibuprofen]     Social History   Social History  . Marital Status: Divorced    Spouse Name: N/A  . Number of Children: N/A  . Years of Education: N/A   Occupational History  . Not on file.   Social History Main Topics  . Smoking status: Current Every Day Smoker -- 0.25 packs/day for 20 years    Types: Cigarettes  . Smokeless tobacco: Never Used  . Alcohol Use: 0.0 - 0.6 oz/week    0-1 Glasses of wine, 0 Standard drinks or equivalent per week     Comment: "occassional alcohol use"  . Drug Use: No  . Sexual Activity: Yes    Birth Control/ Protection: Post-menopausal   Other Topics Concern  . Not on file   Social History Narrative    Family History  Problem Relation Age of Onset  . Cancer Paternal Aunt 70    small bowel  . Breast cancer Maternal Aunt 58  . Breast cancer Mother   . Breast  cancer Sister     younger than age 76  . Breast cancer Sister     younger than age 19  . Breast cancer Maternal Aunt 55  . Breast cancer Cousin 80  . Prostate cancer Brother 60  . Diabetes Paternal Grandmother   . Ovarian cancer Neg Hx     The following portions of the patient's history were reviewed and updated as appropriate: allergies, current medications, past family history, past medical history, past social history, past surgical history and problem list.  Review of Systems Review of Systems - General ROS: negative for - chills, fatigue, fever, hot flashes, malaise or night sweats Hematological and Lymphatic ROS: negative for - bleeding problems or swollen lymph nodes Gastrointestinal ROS: negative for - abdominal pain, blood in stools, change in bowel habits and nausea/vomiting Musculoskeletal ROS: negative for - joint pain, muscle pain or muscular weakness Genito-Urinary ROS: negative for - change in menstrual cycle, dysmenorrhea, dyspareunia, dysuria, genital discharge, genital ulcers, hematuria, incontinence, irregular/heavy menses, nocturia or pelvic painjj  Objective:   BP 175/119 mmHg  Pulse 103  Ht 5' (1.524 m)  Wt 137 lb 6.4 oz (62.324 kg)  BMI 26.83 kg/m2 CONSTITUTIONAL: Well-developed, well-nourished female in no acute distress.  HENT:  Normocephalic, atraumatic.  NECK: Normal range of motion, supple, no masses.  Normal thyroid.  SKIN: Skin is warm and dry. No rash noted. Not diaphoretic. No erythema. No pallor. [place]: Alert and oriented to person, place, and time. PSYCHIATRIC: Normal mood and affect. Normal behavior. Normal judgment and thought content. CARDIOVASCULAR:Not Examined RESPIRATORY: Not Examined BREASTS: Not Examined ABDOMEN: Soft, non distended; Non tender.  No Organomegaly. PELVIC:  External Genitalia: Normal  BUS: Normal  Vagina: Normal Estrogen effect; anterior vaginal wall tender  Cervix: Normal; No cervical motion tenderness  Uterus:  Normal size, shape,consistency, mobile  Adnexa: Normal  RV: Normal   Bladder: Tender MUSCULOSKELETAL: Normal range of motion. No tenderness.  No cyanosis, clubbing, or edema.     Assessment:   1. Pelvic pain in female - POCT urinalysis dipstick - Urine culture   2. UTI  3. History of breast cancer  Plan:   1. Macrobid twice a day for 7 days 2. Increase water intake and cranberry juice intake 3. Follow-up as needed  Brayton Mars, MD  Note: This dictation was prepared with Viviann Spare  dictation along with smaller phrase technology. Any transcriptional errors that result from this process are unintentional.

## 2016-02-03 NOTE — Patient Instructions (Signed)
1. Macrobid twice a day for 7 days 2. Increase water intake as well as cranberry juice intake 3. Follow-up as needed if symptoms persist

## 2016-02-05 LAB — URINE CULTURE

## 2016-05-05 ENCOUNTER — Emergency Department
Admission: EM | Admit: 2016-05-05 | Discharge: 2016-05-05 | Disposition: A | Discharge status: Home / Self Care | Payer: Medicaid Other | Attending: Emergency Medicine | Admitting: Emergency Medicine

## 2016-05-05 ENCOUNTER — Emergency Department: Payer: Medicaid Other

## 2016-05-05 ENCOUNTER — Encounter: Payer: Self-pay | Admitting: Emergency Medicine

## 2016-05-05 DIAGNOSIS — Z853 Personal history of malignant neoplasm of breast: Secondary | ICD-10-CM | POA: Diagnosis not present

## 2016-05-05 DIAGNOSIS — I10 Essential (primary) hypertension: Secondary | ICD-10-CM | POA: Diagnosis not present

## 2016-05-05 DIAGNOSIS — N3090 Cystitis, unspecified without hematuria: Secondary | ICD-10-CM | POA: Diagnosis not present

## 2016-05-05 DIAGNOSIS — F17210 Nicotine dependence, cigarettes, uncomplicated: Secondary | ICD-10-CM | POA: Insufficient documentation

## 2016-05-05 DIAGNOSIS — R1031 Right lower quadrant pain: Secondary | ICD-10-CM | POA: Diagnosis present

## 2016-05-05 DIAGNOSIS — Z79899 Other long term (current) drug therapy: Secondary | ICD-10-CM | POA: Diagnosis not present

## 2016-05-05 HISTORY — DX: Calculus of kidney: N20.0

## 2016-05-05 LAB — CBC
HEMATOCRIT: 46.4 % (ref 35.0–47.0)
HEMOGLOBIN: 15.9 g/dL (ref 12.0–16.0)
MCH: 32.2 pg (ref 26.0–34.0)
MCHC: 34.4 g/dL (ref 32.0–36.0)
MCV: 93.7 fL (ref 80.0–100.0)
Platelets: 213 10*3/uL (ref 150–440)
RBC: 4.95 MIL/uL (ref 3.80–5.20)
RDW: 13.2 % (ref 11.5–14.5)
WBC: 8.6 10*3/uL (ref 3.6–11.0)

## 2016-05-05 LAB — URINALYSIS COMPLETE WITH MICROSCOPIC (ARMC ONLY)
BILIRUBIN URINE: NEGATIVE
Glucose, UA: NEGATIVE mg/dL
Hgb urine dipstick: NEGATIVE
KETONES UR: NEGATIVE mg/dL
NITRITE: POSITIVE — AB
PH: 5 (ref 5.0–8.0)
Protein, ur: NEGATIVE mg/dL
SPECIFIC GRAVITY, URINE: 1.021 (ref 1.005–1.030)

## 2016-05-05 LAB — BASIC METABOLIC PANEL
ANION GAP: 6 (ref 5–15)
BUN: 20 mg/dL (ref 6–20)
CALCIUM: 9.8 mg/dL (ref 8.9–10.3)
CO2: 25 mmol/L (ref 22–32)
Chloride: 107 mmol/L (ref 101–111)
Creatinine, Ser: 0.85 mg/dL (ref 0.44–1.00)
GFR calc Af Amer: >60 mL/min (ref >60)
GFR calc non Af Amer: >60 mL/min (ref >60)
GLUCOSE: 124 mg/dL — AB (ref 65–99)
Potassium: 3.9 mmol/L (ref 3.5–5.1)
Sodium: 138 mmol/L (ref 135–145)

## 2016-05-05 MED ORDER — ONDANSETRON HCL 4 MG/2ML IJ SOLN
4.0000 mg | Freq: Once | INTRAMUSCULAR | Status: AC
  Administered 2016-05-05: 4 mg via INTRAVENOUS

## 2016-05-05 MED ORDER — FENTANYL CITRATE (PF) 100 MCG/2ML IJ SOLN
INTRAMUSCULAR | Status: AC
  Filled 2016-05-05: qty 2

## 2016-05-05 MED ORDER — SODIUM CHLORIDE 0.9 % IV SOLN
Freq: Once | INTRAVENOUS | Status: AC
  Administered 2016-05-05: 16:00:00 via INTRAVENOUS

## 2016-05-05 MED ORDER — CEPHALEXIN 500 MG PO CAPS: 500.0000 mg | ORAL_CAPSULE | Freq: Two times a day (BID) | ORAL | Status: DC

## 2016-05-05 MED ORDER — KETOROLAC TROMETHAMINE 30 MG/ML IJ SOLN
15.0000 mg | INTRAMUSCULAR | Status: AC
  Administered 2016-05-05: 15 mg via INTRAVENOUS
  Filled 2016-05-05: qty 1

## 2016-05-05 MED ORDER — FENTANYL CITRATE (PF) 100 MCG/2ML IJ SOLN
50.0000 ug | INTRAMUSCULAR | Status: DC | PRN
Start: 2016-05-05 — End: 2016-05-05
  Administered 2016-05-05: 50 ug via INTRAVENOUS

## 2016-05-05 MED ORDER — DEXTROSE 5 % IV SOLN
1.0000 g | Freq: Once | INTRAVENOUS | Status: AC
  Administered 2016-05-05: 1 g via INTRAVENOUS
  Filled 2016-05-05 (×2): qty 10

## 2016-05-05 MED ORDER — NAPROXEN 500 MG PO TABS: 500.0000 mg | ORAL_TABLET | Freq: Two times a day (BID) | ORAL | Status: DC

## 2016-05-05 MED ORDER — ONDANSETRON HCL 4 MG/2ML IJ SOLN
INTRAMUSCULAR | Status: AC
  Filled 2016-05-05: qty 2

## 2016-05-05 MED ORDER — PROMETHAZINE HCL 25 MG PO TABS: 25.0000 mg | ORAL_TABLET | Freq: Four times a day (QID) | ORAL | Status: DC | PRN

## 2016-05-05 NOTE — ED Notes (Signed)
Patient transported to CT 

## 2016-05-05 NOTE — Discharge Instructions (Signed)
Infeccin urinaria  (Urinary Tract Infection)  La infeccin urinaria puede ocurrir en Clinical cytogeneticist del tracto urinario. El tracto urinario es un sistema de drenaje del cuerpo por el que se eliminan los desechos y el exceso de [place]. El tracto urinario est formado por dos riones, dos urteres, la vejiga y Geologist, engineering. Los riones son rganos que tienen forma de frijol. Cada rin tiene aproximadamente el tamao del puo. Estn situados debajo de las [place], uno a cada lado de la columna vertebral CAUSAS  La causa de la infeccin son los microbios, que son organismos microscpicos, que incluyen hongos, virus, y bacterias. Estos organismos son tan pequeos que slo pueden verse a travs del microscopio. Las bacterias son los microorganismos que ms comnmente causan infecciones urinarias.  SNTOMAS  Los sntomas pueden variar segn la edad y el sexo del paciente y por la ubicacin de la infeccin. Los sntomas en las mujeres jvenes incluyen la necesidad frecuente e intensa de orinar y una sensacin dolorosa de ardor en la vejiga o en la uretra durante la miccin. Las mujeres y los hombres mayores podrn sentir cansancio, temblores y debilidad y Arts development officer musculares y Social research officer, government abdominal. Si tiene [place], puede significar que la infeccin est en los riones. Otros sntomas son dolor en la espalda o en los lados debajo de las [place], nuseas y vmitos.  DIAGNSTICO  Para diagnosticar una infeccin urinaria, el mdico le preguntar acerca de sus sntomas. [employer] una [place] de [place]. La muestra de orina se analiza para Hydrographic surveyor bacterias y glbulos blancos de Herbalist. Los glbulos blancos se forman en el organismo para ayudar a Radio broadcast assistant las infecciones.  TRATAMIENTO  Por lo general, las infecciones urinarias pueden tratarse con medicamentos. Debido a que la [employer] de las infecciones son causadas por bacterias, por lo general pueden tratarse con antibiticos. La eleccin del  antibitico y la duracin del tratamiento depender de sus sntomas y el tipo de bacteria causante de la infeccin.  INSTRUCCIONES PARA EL CUIDADO EN EL HOGAR   Si le recetaron antibiticos, tmelos exactamente como su mdico le indique. Termine el medicamento aunque se sienta mejor despus de haber tomado slo algunos.  Beba gran cantidad de lquido para mantener la orina de tono claro o color amarillo plido.  Evite la cafena, el t y las bebidas gaseosas. Estas sustancias irritan la vejiga.  Vaciar la vejiga con frecuencia. Evite retener la orina durante largos perodos.  Vace la vejiga antes y despus de Clinical biochemist.  Despus de mover el intestino, las mujeres deben higienizarse la regin perineal desde adelante hacia atrs. Use slo un papel tissue por vez. SOLICITE ATENCIN MDICA SI:   Siente dolor en la espalda.  Le sube la fiebre.  Los sntomas no mejoran luego de 3 das. SOLICITE ATENCIN MDICA DE INMEDIATO SI:   Siente dolor intenso en la espalda o en la zona inferior del abdomen.  Comienza a sentir escalofros.  Tiene nuseas o vmitos.  Tiene una sensacin continua de quemazn o molestias al [employer]. ASEGRESE DE QUE:   Comprende estas instrucciones.  Controlar su enfermedad.  Solicitar ayuda de inmediato si no mejora o empeora.   Esta informacin no tiene Marine scientist el consejo del mdico. Asegrese de hacerle al mdico cualquier pregunta que tenga.   Document Released: 07/14/2005 Document Revised: 06/28/2012 Elsevier Interactive Patient Education [employer].

## 2016-05-05 NOTE — ED Provider Notes (Signed)
[facility] Emergency Department Provider Note  ____________________________________________  Time seen: 5:30 PM  I have reviewed the triage vital signs and the nursing notes.   HISTORY  Chief Complaint Flank Pain    HPI Tricia Wilson is a 60 y.o. female who complains of right flank pain radiating to the right lower quadrant that is colicky and severe when present it feels like a kidney stone. She's had kidney stones about 30 years ago there and the setting of drinking lots of sodas. She started getting soda for lifetime and stopped having kidney stones, but now recently she started drinking soda again. Denies any dysuria frequency or urgency. Denies hematuria fevers chills or vomiting. She has some nausea. No chest pain or shortness of breath. No aggravating or alleviating factors. Pain is sharp     Past Medical History  Diagnosis Date  . Hypertension   . 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 Breast lump 2014  . Breast cancer [facility]) 2013    Right breast cancer - Radiation  . Kidney stone      Patient Active Problem List   Diagnosis Date Noted  . UTI (lower urinary tract infection) 02/03/2016  . Breast mass, right 08/12/2015  . DCIS (ductal carcinoma in situ) of breast 05/10/2013     Past Surgical History  Procedure Laterality Date  . Wisdom tooth extraction    . Shoulder arthroscopy Right 1996  . Breast surgery Right 05-16-2013    lumpectomy  . Breast biopsy Right 08/12/2015    US guided biopsy w/ clip placement/SPINDLE CELL PROLIFERATION WITH CYTOLOGIC ATYPIA  . Breast lumpectomy Right 08/29/2015    Procedure: BREAST LUMPECTOMY;  Surgeon: Christene Lye, MD;  Location: [facility] ORS;  Service: General;  Laterality: Right;  . Tubal ligation  [place]      Current Outpatient Rx  Name  Route  Sig  Dispense  Refill  . amLODipine (NORVASC) 5 MG tablet   Oral   Take 5 mg by  mouth daily. In am         . cephALEXin (KEFLEX) 500 MG capsule   Oral   Take 1 capsule (500 mg total) by mouth 2 (two) times daily.   14 capsule   0   . cyclobenzaprine (FLEXERIL) 10 MG tablet      1 tab QHS Patient taking differently: at bedtime. 1 tab QHS   30 tablet   0   . dicyclomine (BENTYL) 10 MG capsule   Oral   Take 10 mg by mouth 4 (four) times daily -  before meals and at bedtime. As needed.         . hydrochlorothiazide (HYDRODIURIL) 25 MG tablet   Oral   Take 25 mg by mouth daily. In am         . Hydrocodone-Acetaminophen (VICODIN) 5-300 MG TABS   Oral   Take 1 tablet by mouth 4 (four) times daily as needed (moderate pain).   30 each   0   . naproxen (NAPROSYN) 500 MG tablet   Oral   Take 1 tablet (500 mg total) by mouth 2 (two) times daily with a meal.   20 tablet   0   . nitrofurantoin, macrocrystal-monohydrate, (MACROBID) 100 MG capsule   Oral   Take 1 capsule (100 mg total) by mouth 2 (two) times daily.   14 capsule   0   .  omeprazole (PRILOSEC) 20 MG capsule   Oral   Take 20 mg by mouth daily. As needed.         . promethazine (PHENERGAN) 25 MG tablet   Oral   Take 1 tablet (25 mg total) by mouth every 6 (six) hours as needed for nausea or vomiting.   15 tablet   0   . traMADol (ULTRAM) 50 MG tablet   Oral   Take 1 tablet (50 mg total) by mouth 3 (three) times daily as needed for pain.   30 tablet   0      Allergies Morphine and related   Family History  Problem Relation Age of Onset  . Cancer Paternal Aunt 40    small bowel  . Breast cancer Maternal Aunt 15  . Breast cancer Mother   . Breast cancer Sister     younger than age 36  . Breast cancer Sister     younger than age 44  . Breast cancer Maternal Aunt 43  . Breast cancer Cousin 45  . Prostate cancer Brother 39  . Diabetes Paternal Grandmother   . Ovarian cancer Neg Hx     Social History Social History  Substance Use Topics  . Smoking status: Current  Every Day Smoker -- 0.25 packs/day for 20 years    Types: Cigarettes  . Smokeless tobacco: Never Used  . Alcohol Use: 0.0 - 0.6 oz/week    0-1 Glasses of wine, 0 Standard drinks or equivalent per week     Comment: "occassional alcohol use"    Review of Systems  Constitutional:   No fever or chills.  ENT:   No sore throat. No rhinorrhea. Cardiovascular:   No chest pain. Respiratory:   No dyspnea or cough. Gastrointestinal:   Right flank pain. No vomiting or diarrhea.  Genitourinary:   Negative for dysuria or difficulty urinating. Musculoskeletal:   Negative for focal pain or swelling Neurological:   Negative for headaches 10-point ROS otherwise negative.  ____________________________________________   PHYSICAL EXAM:  VITAL SIGNS: ED Triage Vitals  Enc Vitals Group     BP 05/05/16 1542 163/109 mmHg     Pulse Rate 05/05/16 1542 79     Resp 05/05/16 1542 20     Temp 05/05/16 1542 98.3 F (36.8 C)     Temp Source 05/05/16 1542 Oral     SpO2 05/05/16 1542 97 %     Weight 05/05/16 1542 140 lb (63.504 kg)     Height 05/05/16 1542 5' (1.524 m)     Head Cir --      Peak Flow --      Pain Score 05/05/16 1543 10     Pain Loc --      Pain Edu? --      Excl. in [place]? --     Vital signs reviewed, nursing assessments reviewed.   Constitutional:   Alert and oriented. Well appearing and in no distress. Eyes:   No scleral icterus. No conjunctival pallor. PERRL. EOMI.  No nystagmus. ENT   Head:   Normocephalic and atraumatic.   Nose:   No congestion/rhinnorhea. No septal hematoma   Mouth/Throat:   MMM, no pharyngeal erythema. No peritonsillar mass.    Neck:   No stridor. No SubQ emphysema. No meningismus. Hematological/Lymphatic/Immunilogical:   No cervical lymphadenopathy. Cardiovascular:   RRR. Symmetric bilateral radial and DP pulses.  No murmurs.  Respiratory:   Normal respiratory effort without tachypnea nor retractions. Breath sounds are clear and equal  bilaterally. No wheezes/rales/rhonchi. Gastrointestinal:   Soft with suprapubic and right-sided tenderness, mild. Non distended. There is no CVA tenderness.  No rebound, rigidity, or guarding. Genitourinary:   deferred Musculoskeletal:   Nontender with normal range of motion in all extremities. No joint effusions.  No lower extremity tenderness.  No edema. Neurologic:   Normal speech and language.  CN 2-10 normal. Motor grossly intact. No gross focal neurologic deficits are appreciated.  Skin:    Skin is warm, dry and intact. No rash noted.  No petechiae, purpura, or bullae.  ____________________________________________    LABS (pertinent positives/negatives) (all labs ordered are listed, but only abnormal results are displayed) Labs Reviewed  URINALYSIS COMPLETEWITH MICROSCOPIC ([place]) - Abnormal; Notable for the following:    Color, Urine YELLOW (*)    APPearance HAZY (*)    Nitrite POSITIVE (*)    Leukocytes, UA TRACE (*)    Bacteria, UA MANY (*)    Squamous Epithelial / LPF 0-5 (*)    All other components within normal limits  BASIC METABOLIC PANEL - Abnormal; Notable for the following:    Glucose, Bld 124 (*)    All other components within normal limits  CBC   ____________________________________________   EKG    ____________________________________________    RADIOLOGY  CT abdomen and pelvis unremarkable  ____________________________________________   PROCEDURES   ____________________________________________   INITIAL IMPRESSION / ASSESSMENT AND PLAN / ED COURSE  Pertinent labs & imaging results that were available during my care of the patient were reviewed by me and considered in my medical decision making (see chart for details).  Patient well appearing no acute distress. Pain is currently diminished. We'll give IV Toradol along with IV fluids for symptom relief. Check CT scan to evaluate for stone. Follow-up urinalysis. Serum labs are  unremarkable. Vital signs are unremarkable.  ----------------------------------------- 7:19 PM on 05/05/2016 -----------------------------------------  Patient well-appearing, symptoms controlled. Tolerating oral intake. Vital signs unremarkable. Workup reveals urinary tract infection. She was previously treated with Bactrim for urinary tract infection. Review of electronic medical record shows that urine culture from April 2017 shows Escherichia coli resistant to Bactrim susceptible to beta lactams and Macrobid. Ceftriaxone given in the ED. I'll continue her on Keflex, follow up with primary care in 1 week. Presentation currently not consistent with pyelonephritis or sepsis.     ____________________________________________   FINAL CLINICAL IMPRESSION(S) / ED DIAGNOSES  Final diagnoses:  Cystitis       Portions of this note were generated with dragon dictation software. Dictation errors may occur despite best attempts at proofreading.   Carrie Mew, MD 05/05/16 1920

## 2016-05-05 NOTE — ED Notes (Signed)
Pt states R sided pain x 3 days. Pt states she was told "infection in urine 2 weeks ago but I don't have burning or pain or frequency." Pt states she took a pill for 7 days for urine infection.  Pt states "pain in my kidney". Pt denies diarrhea and fevers. Pt states some dizziness and nausea.

## 2016-05-05 NOTE — ED Notes (Signed)
Pt transferred to CT via stretcher.  

## 2016-05-05 NOTE — ED Notes (Signed)
Patient to ER for c/o right sided flank pain x3 days. States she has had kidney stones many years ago. Reports chills, but denies any known fevers. +Nausea.

## 2017-03-23 ENCOUNTER — Encounter: Payer: Self-pay | Admitting: Emergency Medicine

## 2017-03-23 ENCOUNTER — Emergency Department
Admission: EM | Admit: 2017-03-23 | Discharge: 2017-03-23 | Disposition: A | Discharge status: Home / Self Care | Payer: Medicaid Other | Attending: Emergency Medicine | Admitting: Emergency Medicine

## 2017-03-23 DIAGNOSIS — I10 Essential (primary) hypertension: Secondary | ICD-10-CM | POA: Insufficient documentation

## 2017-03-23 DIAGNOSIS — Z79899 Other long term (current) drug therapy: Secondary | ICD-10-CM | POA: Diagnosis not present

## 2017-03-23 DIAGNOSIS — Z853 Personal history of malignant neoplasm of breast: Secondary | ICD-10-CM | POA: Diagnosis not present

## 2017-03-23 DIAGNOSIS — F17210 Nicotine dependence, cigarettes, uncomplicated: Secondary | ICD-10-CM | POA: Diagnosis not present

## 2017-03-23 DIAGNOSIS — J029 Acute pharyngitis, unspecified: Secondary | ICD-10-CM | POA: Diagnosis not present

## 2017-03-23 DIAGNOSIS — R070 Pain in throat: Secondary | ICD-10-CM | POA: Diagnosis present

## 2017-03-23 LAB — POCT RAPID STREP A: Streptococcus, Group A Screen (Direct): NEGATIVE

## 2017-03-23 MED ORDER — PREDNISONE 10 MG (21) PO TBPK: ORAL_TABLET | ORAL | 0 refills | Status: DC

## 2017-03-23 MED ORDER — DEXAMETHASONE SODIUM PHOSPHATE 10 MG/ML IJ SOLN
10.0000 mg | Freq: Once | INTRAMUSCULAR | Status: AC
  Administered 2017-03-23: 10 mg via INTRAMUSCULAR
  Filled 2017-03-23: qty 1

## 2017-03-23 MED ORDER — AMOXICILLIN 500 MG PO CAPS: 500.0000 mg | ORAL_CAPSULE | Freq: Two times a day (BID) | ORAL | 0 refills | Status: DC

## 2017-03-23 NOTE — ED Triage Notes (Signed)
Pt reports sore throat for two weeks. Denies any other symptoms. Pt in no apparent distress in triage.

## 2017-03-23 NOTE — ED Provider Notes (Signed)
[facility] Emergency Department Provider Note  ____________________________________________  Time seen: Approximately 3:38 PM  I have reviewed the triage vital signs and the nursing notes.   HISTORY  Chief Complaint Sore Throat    HPI Tricia Wilson is a 61 y.o. female that presents to the emergency department with sore throat for 2 weeks. She states that it is painful to swallow. She is eating and drinking well.No alleviating measures have been tried. She had a fever of 101 2 days ago. She denies nasal congestion, shortness of breath, chest pain, nausea, vomiting, abdominal pain.   Past Medical History:  Diagnosis Date  . Breast cancer [facility]) 2013   Right breast cancer - Radiation  . Breast lump 2014  . 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 Hypertension   . Kidney stone     Patient Active Problem List   Diagnosis Date Noted  . UTI (lower urinary tract infection) 02/03/2016  . Breast mass, right 08/12/2015  . DCIS (ductal carcinoma in situ) of breast 05/10/2013    Past Surgical History:  Procedure Laterality Date  . BREAST BIOPSY Right 08/12/2015   US guided biopsy w/ clip placement/SPINDLE CELL PROLIFERATION WITH CYTOLOGIC ATYPIA  . BREAST LUMPECTOMY Right 08/29/2015   Procedure: BREAST LUMPECTOMY;  Surgeon: Christene Lye, MD;  Location: [facility] ORS;  Service: General;  Laterality: Right;  . BREAST SURGERY Right 05-16-2013   lumpectomy  . SHOULDER ARTHROSCOPY Right 1996  . [place]   . WISDOM TOOTH EXTRACTION      Prior to Admission medications   Medication Sig Start Date End Date Taking? Authorizing Provider  amLODipine (NORVASC) 5 MG tablet Take 5 mg by mouth daily. In am    [provider]  amoxicillin (AMOXIL) 500 MG capsule Take 1 capsule (500 mg total) by mouth 2 (two) times daily. 03/23/17   Laban Emperor, PA-C  cephALEXin (KEFLEX) 500 MG  capsule Take 1 capsule (500 mg total) by mouth 2 (two) times daily. 05/05/16   Carrie Mew, MD  cyclobenzaprine (FLEXERIL) 10 MG tablet 1 tab QHS Patient taking differently: at bedtime. 1 tab QHS 07/15/15   Harvest Dark, PA-C  dicyclomine (BENTYL) 10 MG capsule Take 10 mg by mouth 4 (four) times daily -  before meals and at bedtime. As needed.    [provider]  hydrochlorothiazide (HYDRODIURIL) 25 MG tablet Take 25 mg by mouth daily. In am    [provider]  Hydrocodone-Acetaminophen (VICODIN) 5-300 MG TABS Take 1 tablet by mouth 4 (four) times daily as needed (moderate pain). 08/29/15   Christene Lye, MD  naproxen (NAPROSYN) 500 MG tablet Take 1 tablet (500 mg total) by mouth 2 (two) times daily with a meal. 05/05/16   Carrie Mew, MD  nitrofurantoin, macrocrystal-monohydrate, (MACROBID) 100 MG capsule Take 1 capsule (100 mg total) by mouth 2 (two) times daily. 02/03/16   Defrancesco, Alanda Slim, MD  omeprazole (PRILOSEC) 20 MG capsule Take 20 mg by mouth daily. As needed.    [provider]  predniSONE (STERAPRED UNI-PAK 21 TAB) 10 MG (21) TBPK tablet Take 6 tablets on day 1, take 5 tablets on day 2, take 4 tablets on day 3, take 3 tablets on day 4, take 2 tablets on day 5, take 1 tablet on day 6 03/23/17   Laban Emperor, PA-C  promethazine (PHENERGAN) 25 MG tablet Take 1  tablet (25 mg total) by mouth every 6 (six) hours as needed for nausea or vomiting. 05/05/16   Carrie Mew, MD  traMADol (ULTRAM) 50 MG tablet Take 1 tablet (50 mg total) by mouth 3 (three) times daily as needed for pain. 08/01/13   Christene Lye, MD    Allergies Morphine and related  Family History  Problem Relation Age of Onset  . Breast cancer Mother   . Cancer Paternal Aunt 66       small bowel  . Breast cancer Maternal Aunt 90  . Breast cancer Sister        younger than age 37  . Breast cancer Sister        younger than age 72  . Breast cancer Maternal  Aunt 23  . Breast cancer Cousin 47  . Prostate cancer Brother 22  . Diabetes Paternal Grandmother   . Ovarian cancer Neg Hx     Social History Social History  Substance Use Topics  . Smoking status: Current Every Day Smoker    Packs/day: 0.25    Years: 20.00    Types: Cigarettes  . Smokeless tobacco: Never Used  . Alcohol use 0.0 - 0.6 oz/week     Comment: "occassional alcohol use"     Review of Systems  Eyes: No discharge. ENT: Negative for congestion and rhinorrhea. Cardiovascular: No chest pain. Respiratory: Negative for cough. No SOB. Gastrointestinal: No abdominal pain.  No nausea, no vomiting.  No diarrhea.  No constipation. Musculoskeletal: Negative for musculoskeletal pain. Skin: Negative for rash, abrasions, lacerations, ecchymosis. Neurological: Negative for headaches.   ____________________________________________   PHYSICAL EXAM:  VITAL SIGNS: ED Triage Vitals  Enc Vitals Group     BP 03/23/17 1332 (!) 170/109     Pulse Rate 03/23/17 1332 86     Resp 03/23/17 1332 18     Temp 03/23/17 1332 98.5 F (36.9 C)     Temp Source 03/23/17 1332 Oral     SpO2 03/23/17 1332 94 %     Weight 03/23/17 1336 140 lb (63.5 kg)     Height 03/23/17 1336 5' (1.524 m)     Head Circumference --      Peak Flow --      Pain Score 03/23/17 1332 9     Pain Loc --      Pain Edu? --      Excl. in [place]? --      Constitutional: Alert and oriented. Well appearing and in no acute distress. Eyes: Conjunctivae are normal. PERRL. EOMI. No discharge. Head: Atraumatic. ENT: No frontal and maxillary sinus tenderness.      Ears: Tympanic membranes pearly gray with good landmarks. No discharge.      Nose: No congestion/rhinnorhea.      Mouth/Throat: Mucous membranes are moist. Oropharynx non-erythematous. Tonsils not enlarged. No exudates. Uvula midline. Neck: No stridor.   Hematological/Lymphatic/Immunilogical: No cervical lymphadenopathy. Cardiovascular: Normal rate, regular  rhythm.  Good peripheral circulation. Respiratory: Normal respiratory effort without tachypnea or retractions. Lungs CTAB. Good air entry to the bases with no decreased or absent breath sounds. Gastrointestinal: Bowel sounds 4 quadrants. Soft and nontender to palpation. No guarding or rigidity. No palpable masses. No distention. Musculoskeletal: Full range of motion to all extremities. No gross deformities appreciated. Neurologic:  Normal speech and language. No gross focal neurologic deficits are appreciated.  Skin:  Skin is warm, dry and intact. No rash noted.   ____________________________________________   LABS (all labs ordered are listed, but  only abnormal results are displayed)  Labs Reviewed  CULTURE, GROUP A STREP [facility])  POCT RAPID STREP A   ____________________________________________  EKG   ____________________________________________  RADIOLOGY  No results found.  ____________________________________________    PROCEDURES  Procedure(s) performed:    Procedures    Medications  dexamethasone (DECADRON) injection 10 mg (10 mg Intramuscular Given 03/23/17 1550)     ____________________________________________   INITIAL IMPRESSION / ASSESSMENT AND PLAN / ED COURSE  Pertinent labs & imaging results that were available during my care of the patient were reviewed by me and considered in my medical decision making (see chart for details).  Review of the [place] CSRS was performed in accordance of the [place] prior to dispensing any controlled drugs.     Patient's diagnosis is consistent with pharyngitis. Vital signs and exam are reassuring. Patient appears well and is staying well hydrated. Patient feels comfortable going home. IM decadron was given in ED. Patient will be discharged home with prescriptions for amoxicillin and prednisone. Patient is to follow up with PCP as needed or otherwise directed. Patient is given ED precautions to return to the ED for any  worsening or new symptoms.     ____________________________________________  FINAL CLINICAL IMPRESSION(S) / ED DIAGNOSES  Final diagnoses:  Pharyngitis, unspecified etiology      NEW MEDICATIONS STARTED DURING THIS VISIT:  Discharge Medication List as of 03/23/2017  3:39 PM    START taking these medications   Details  amoxicillin (AMOXIL) 500 MG capsule Take 1 capsule (500 mg total) by mouth 2 (two) times daily., Starting Wed 03/23/2017, Print    predniSONE (STERAPRED UNI-PAK 21 TAB) 10 MG (21) TBPK tablet Take 6 tablets on day 1, take 5 tablets on day 2, take 4 tablets on day 3, take 3 tablets on day 4, take 2 tablets on day 5, take 1 tablet on day 6, Print            This chart was dictated using voice recognition software/Dragon. Despite best efforts to proofread, errors can occur which can change the meaning. Any change was purely unintentional.    Laban Emperor, PA-C 03/23/17 1700    Schuyler Amor, MD 03/24/17 1110

## 2017-03-23 NOTE — ED Notes (Signed)
Pt presents with sore throat x 2 weeks; denies runny nose or cough. Some ear pain as well in right ear. NAD noted.

## 2017-03-23 NOTE — ED Notes (Signed)
Pt discharged home after verbalizing understanding of discharge instructions; nad noted. 

## 2017-03-26 LAB — CULTURE, GROUP A STREP (THRC)

## 2017-05-29 ENCOUNTER — Encounter: Payer: Self-pay | Admitting: Emergency Medicine

## 2017-05-29 DIAGNOSIS — M545 Low back pain: Secondary | ICD-10-CM | POA: Diagnosis present

## 2017-05-29 DIAGNOSIS — Z79899 Other long term (current) drug therapy: Secondary | ICD-10-CM | POA: Diagnosis not present

## 2017-05-29 DIAGNOSIS — I10 Essential (primary) hypertension: Secondary | ICD-10-CM | POA: Insufficient documentation

## 2017-05-29 DIAGNOSIS — M5441 Lumbago with sciatica, right side: Secondary | ICD-10-CM | POA: Diagnosis not present

## 2017-05-29 DIAGNOSIS — Y93G3 Activity, cooking and baking: Secondary | ICD-10-CM | POA: Diagnosis not present

## 2017-05-29 DIAGNOSIS — Y33XXXA Other specified events, undetermined intent, initial encounter: Secondary | ICD-10-CM | POA: Insufficient documentation

## 2017-05-29 DIAGNOSIS — M5442 Lumbago with sciatica, left side: Secondary | ICD-10-CM | POA: Insufficient documentation

## 2017-05-29 DIAGNOSIS — Y999 Unspecified external cause status: Secondary | ICD-10-CM | POA: Insufficient documentation

## 2017-05-29 DIAGNOSIS — F17210 Nicotine dependence, cigarettes, uncomplicated: Secondary | ICD-10-CM | POA: Diagnosis not present

## 2017-05-29 DIAGNOSIS — S39012A Strain of muscle, fascia and tendon of lower back, initial encounter: Secondary | ICD-10-CM | POA: Insufficient documentation

## 2017-05-29 DIAGNOSIS — Y92000 Kitchen of unspecified non-institutional (private) residence as  the place of occurrence of the external cause: Secondary | ICD-10-CM | POA: Insufficient documentation

## 2017-05-29 LAB — URINALYSIS, COMPLETE (UACMP) WITH MICROSCOPIC
BACTERIA UA: NONE SEEN
BILIRUBIN URINE: NEGATIVE
Glucose, UA: NEGATIVE mg/dL
Hgb urine dipstick: NEGATIVE
Ketones, ur: NEGATIVE mg/dL
NITRITE: NEGATIVE
PH: 5 (ref 5.0–8.0)
Protein, ur: NEGATIVE mg/dL
SPECIFIC GRAVITY, URINE: 1.011 (ref 1.005–1.030)
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
## 2017-05-30 ENCOUNTER — Emergency Department
Admission: EM | Admit: 2017-05-30 | Discharge: 2017-05-30 | Disposition: A | Discharge status: Home / Self Care | Payer: Medicaid Other | Attending: Emergency Medicine | Admitting: Emergency Medicine

## 2017-05-30 DIAGNOSIS — S39012A Strain of muscle, fascia and tendon of lower back, initial encounter: Secondary | ICD-10-CM

## 2017-05-30 DIAGNOSIS — M5441 Lumbago with sciatica, right side: Secondary | ICD-10-CM

## 2017-05-30 DIAGNOSIS — M5442 Lumbago with sciatica, left side: Secondary | ICD-10-CM

## 2017-05-30 MED ORDER — TRAMADOL HCL 50 MG PO TABS: 50.0000 mg | ORAL_TABLET | Freq: Four times a day (QID) | ORAL | 0 refills | Status: DC | PRN

## 2017-05-30 MED ORDER — LIDOCAINE 5 % EX PTCH: 1.0000 | MEDICATED_PATCH | Freq: Two times a day (BID) | CUTANEOUS | 0 refills | Status: AC

## 2017-05-30 MED ORDER — DIAZEPAM 2 MG PO TABS: 2.0000 mg | ORAL_TABLET | Freq: Four times a day (QID) | ORAL | 0 refills | Status: DC | PRN

## 2017-05-30 MED ORDER — LIDOCAINE 5 % EX PTCH
1.0000 | MEDICATED_PATCH | CUTANEOUS | Status: DC
  Administered 2017-05-30: 1 via TRANSDERMAL
  Filled 2017-05-30: qty 1

## 2017-05-30 MED ORDER — KETOROLAC TROMETHAMINE 60 MG/2ML IM SOLN
60.0000 mg | Freq: Once | INTRAMUSCULAR | Status: AC
  Administered 2017-05-30: 60 mg via INTRAMUSCULAR
  Filled 2017-05-30: qty 2

## 2017-05-30 MED ORDER — DIAZEPAM 2 MG PO TABS
2.0000 mg | ORAL_TABLET | Freq: Once | ORAL | Status: AC
  Administered 2017-05-30: 2 mg via ORAL
  Filled 2017-05-30: qty 1

## 2017-05-30 MED ORDER — TRAMADOL HCL 50 MG PO TABS
50.0000 mg | ORAL_TABLET | Freq: Once | ORAL | Status: AC
  Administered 2017-05-30: 50 mg via ORAL
  Filled 2017-05-30: qty 1

## 2017-05-30 NOTE — ED Notes (Signed)
ED Provider at bedside. 

## 2017-05-30 NOTE — Discharge Instructions (Signed)
PLease follow up with your primary care physician °

## 2017-05-30 NOTE — ED Provider Notes (Signed)
[facility] Emergency Department Provider Note   ____________________________________________   First MD Initiated Contact with Patient 05/30/17 0028     (approximate)  I have reviewed the triage vital signs and the nursing notes.   HISTORY  Chief Complaint Back Pain    HPI Tricia Wilson is a 61 y.o. female who comes into the hospital today with back pain. The patient states that she's had pain for the past 2-3 days. She also reports that her feet are swollen. The patient states that she's been in her house cooking and cleaning. The patient took some Tylenol and Aleve. She states that yesterday her friend gave her a small dose of Percocet and her pain improved enough for her to sleep. She woke up this morning though with pain again. She reports that she cannot walk up the steps to go into her room. She states that she has had pain like this before. She reports about a year or so ago she was in an accident where she picked up a table and she had this kind of pain. She was told that she strained her back. The patient rates her pain a 9 out of 10 in intensity currently. She reports that yesterday was a 20 out of 10. The patient has had some nausea with occasional dizziness. She states that the leg swelling started this morning. The patient reports that she's been on her feet more and she's been picking up garbage and bending a lot. She is here today for evaluation.   Past Medical History:  Diagnosis Date  . Breast cancer [facility]) 2013   Right breast cancer - Radiation  . Breast lump 2014  . 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 Hypertension   . Kidney stone     Patient Active Problem List   Diagnosis Date Noted  . UTI (lower urinary tract infection) 02/03/2016  . Breast mass, right 08/12/2015  . DCIS (ductal carcinoma in situ) of breast 05/10/2013    Past Surgical History:  Procedure Laterality Date  .  BREAST BIOPSY Right 08/12/2015   US guided biopsy w/ clip placement/SPINDLE CELL PROLIFERATION WITH CYTOLOGIC ATYPIA  . BREAST LUMPECTOMY Right 08/29/2015   Procedure: BREAST LUMPECTOMY;  Surgeon: Christene Lye, MD;  Location: [facility] ORS;  Service: General;  Laterality: Right;  . BREAST SURGERY Right 05-16-2013   lumpectomy  . SHOULDER ARTHROSCOPY Right 1996  . [place]   . WISDOM TOOTH EXTRACTION      Prior to Admission medications   Medication Sig Start Date End Date Taking? Authorizing Provider  amLODipine (NORVASC) 5 MG tablet Take 5 mg by mouth daily. In am    [provider]  amoxicillin (AMOXIL) 500 MG capsule Take 1 capsule (500 mg total) by mouth 2 (two) times daily. 03/23/17   Laban Emperor, PA-C  cephALEXin (KEFLEX) 500 MG capsule Take 1 capsule (500 mg total) by mouth 2 (two) times daily. 05/05/16   Carrie Mew, MD  cyclobenzaprine (FLEXERIL) 10 MG tablet 1 tab QHS Patient taking differently: at bedtime. 1 tab QHS 07/15/15   Harvest Dark, PA-C  diazepam (VALIUM) 2 MG tablet Take 1 tablet (2 mg total) by mouth every 6 (six) hours as needed for anxiety. 05/30/17   Loney Hering, MD  dicyclomine (BENTYL) 10 MG capsule Take 10 mg by mouth 4 (four) times daily -  before meals  and at bedtime. As needed.    [provider]  hydrochlorothiazide (HYDRODIURIL) 25 MG tablet Take 25 mg by mouth daily. In am    [provider]  Hydrocodone-Acetaminophen (VICODIN) 5-300 MG TABS Take 1 tablet by mouth 4 (four) times daily as needed (moderate pain). 08/29/15   Sankar, Andreas Newport, MD  lidocaine (LIDODERM) 5 % Place 1 patch onto the skin every 12 (twelve) hours. Remove & Discard patch within 12 hours or as directed by MD 05/30/17 05/30/18  Loney Hering, MD  naproxen (NAPROSYN) 500 MG tablet Take 1 tablet (500 mg total) by mouth 2 (two) times daily with a meal. 05/05/16   Carrie Mew, MD  nitrofurantoin,  macrocrystal-monohydrate, (MACROBID) 100 MG capsule Take 1 capsule (100 mg total) by mouth 2 (two) times daily. 02/03/16   Defrancesco, Alanda Slim, MD  omeprazole (PRILOSEC) 20 MG capsule Take 20 mg by mouth daily. As needed.    [provider]  predniSONE (STERAPRED UNI-PAK 21 TAB) 10 MG (21) TBPK tablet Take 6 tablets on day 1, take 5 tablets on day 2, take 4 tablets on day 3, take 3 tablets on day 4, take 2 tablets on day 5, take 1 tablet on day 6 03/23/17   Laban Emperor, PA-C  promethazine (PHENERGAN) 25 MG tablet Take 1 tablet (25 mg total) by mouth every 6 (six) hours as needed for nausea or vomiting. 05/05/16   Carrie Mew, MD  traMADol (ULTRAM) 50 MG tablet Take 1 tablet (50 mg total) by mouth 3 (three) times daily as needed for pain. 08/01/13   Christene Lye, MD  traMADol (ULTRAM) 50 MG tablet Take 1 tablet (50 mg total) by mouth every 6 (six) hours as needed. 05/30/17   Loney Hering, MD    Allergies Morphine and related  Family History  Problem Relation Age of Onset  . Breast cancer Mother   . Cancer Paternal Aunt 8       small bowel  . Breast cancer Maternal Aunt 86  . Breast cancer Sister        younger than age 38  . Breast cancer Sister        younger than age 58  . Breast cancer Maternal Aunt 25  . Breast cancer Cousin 54  . Prostate cancer Brother 8  . Diabetes Paternal Grandmother   . Ovarian cancer Neg Hx     Social History Social History  Substance Use Topics  . Smoking status: Current Every Day Smoker    Packs/day: 0.25    Years: 20.00    Types: Cigarettes  . Smokeless tobacco: Never Used  . Alcohol use 0.0 - 0.6 oz/week     Comment: "occassional alcohol use"    Review of Systems  Constitutional: No fever/chills Eyes: No visual changes. ENT: No sore throat. Cardiovascular: Denies chest pain. Respiratory: Denies shortness of breath. Gastrointestinal: No abdominal pain.  No nausea, no vomiting.  No diarrhea.  No  constipation. Genitourinary: Negative for dysuria. Musculoskeletal:  back pain. Skin: Negative for rash. Neurological: Negative for headaches, focal weakness or numbness.   ____________________________________________   PHYSICAL EXAM:  VITAL SIGNS: ED Triage Vitals  Enc Vitals Group     BP 05/29/17 2235 (!) 168/105     Pulse Rate 05/29/17 2235 89     Resp 05/29/17 2235 18     Temp 05/29/17 2235 98.2 F (36.8 C)     Temp Source 05/29/17 2235 Oral     SpO2 05/29/17 2235  97 %     Weight 05/29/17 2236 130 lb (59 kg)     Height 05/29/17 2236 5' (1.524 m)     Head Circumference --      Peak Flow --      Pain Score 05/29/17 2235 9     Pain Loc --      Pain Edu? --      Excl. in [place]? --     Constitutional: Alert and oriented. Well appearing and in Moderate distress. Eyes: Conjunctivae are normal. PERRL. EOMI. Head: Atraumatic. Nose: No congestion/rhinnorhea. Mouth/Throat: Mucous membranes are moist.  Oropharynx non-erythematous. Cardiovascular: Normal rate, regular rhythm. Grossly normal heart sounds.  Good peripheral circulation. Respiratory: Normal respiratory effort.  No retractions. Lungs CTAB. Gastrointestinal: Soft and nontender. No distention. Positive bowel sounds Musculoskeletal: Mild bilateral lower extremity edema. Bilateral paraspinous muscles tenderness to palpation no midline lumbar spine tenderness palpation. Pain with straight leg raise. Neurologic:  Normal speech and language.  Skin:  Skin is warm, dry and intact.  Psychiatric: Mood and affect are normal.   ____________________________________________   LABS (all labs ordered are listed, but only abnormal results are displayed)  Labs Reviewed  URINALYSIS, COMPLETE (UACMP) WITH MICROSCOPIC - Abnormal; Notable for the following:       Result Value   Color, Urine STRAW (*)    APPearance CLEAR (*)    Leukocytes, UA SMALL (*)    Squamous Epithelial / LPF 0-5 (*)    All other components within normal limits    ____________________________________________  EKG  none ____________________________________________  RADIOLOGY  No results found.  ____________________________________________   PROCEDURES  Procedure(s) performed: None  Procedures  Critical Care performed: No  ____________________________________________   INITIAL IMPRESSION / ASSESSMENT AND PLAN / ED COURSE  Pertinent labs & imaging results that were available during my care of the patient were reviewed by me and considered in my medical decision making (see chart for details).  This is a 61 year old who comes into the hospital today with some back pain. The patient states that she's had this back pain in the past. I will give the patient Lidoderm patch, Toradol, tramadol and Valium. The patient will be reassessed.     I reassessed the patient and she reports that her pain is improved. I informed her that she should follow back up with her primary care physician. The patient's hydrochlorothiazide should help with her swelling in her legs. The patient understands and agrees. She will be discharged home to follow-up with her primary care physician.  ____________________________________________   FINAL CLINICAL IMPRESSION(S) / ED DIAGNOSES  Final diagnoses:  Strain of lumbar region, initial encounter  Bilateral low back pain with bilateral sciatica, unspecified chronicity      NEW MEDICATIONS STARTED DURING THIS VISIT:  New Prescriptions   DIAZEPAM (VALIUM) 2 MG TABLET    Take 1 tablet (2 mg total) by mouth every 6 (six) hours as needed for anxiety.   LIDOCAINE (LIDODERM) 5 %    Place 1 patch onto the skin every 12 (twelve) hours. Remove & Discard patch within 12 hours or as directed by MD   TRAMADOL (ULTRAM) 50 MG TABLET    Take 1 tablet (50 mg total) by mouth every 6 (six) hours as needed.     Note:  This document was prepared using Dragon voice recognition software and may include unintentional dictation  errors.    Loney Hering, MD 05/30/17 [phone]

## 2017-08-21 IMAGING — CT CT RENAL STONE PROTOCOL
3 of 4 series · 8 of 46 positions shown, 15 images · non-contrast
Comparison: Lumbar spine radiographs - 07/15/2015

CLINICAL DATA: Right-sided flank pain for the past 3 days. Remote
history of renal stones.

EXAM:
CT ABDOMEN AND PELVIS WITHOUT CONTRAST
TECHNIQUE: Multidetector CT imaging of the abdomen and pelvis was performed
following the standard protocol without IV contrast.

[Series 4: lung · axial · 0.72mm/px · z∈[-330,-275]mm · 4 of 19 slices shown, 9 images]
[im 4/19  soft-tissue]
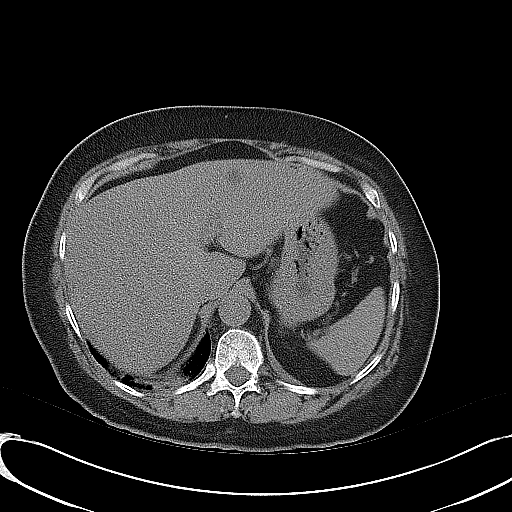
[im 4/19  lung]
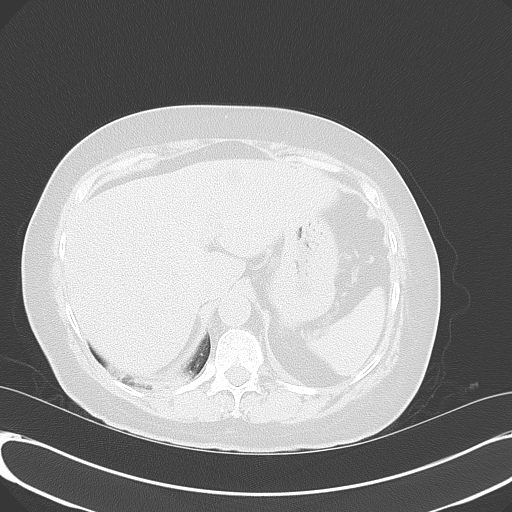
[im 4/19  bone]
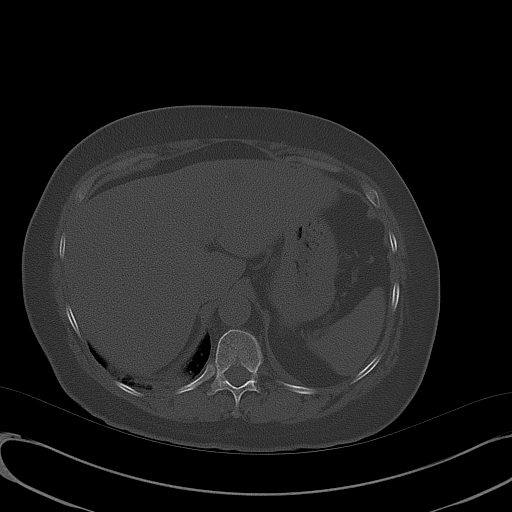
[im 8/19  soft-tissue]
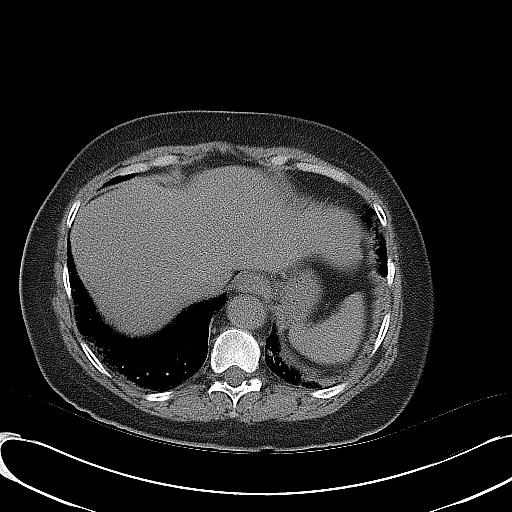
[im 8/19  lung]
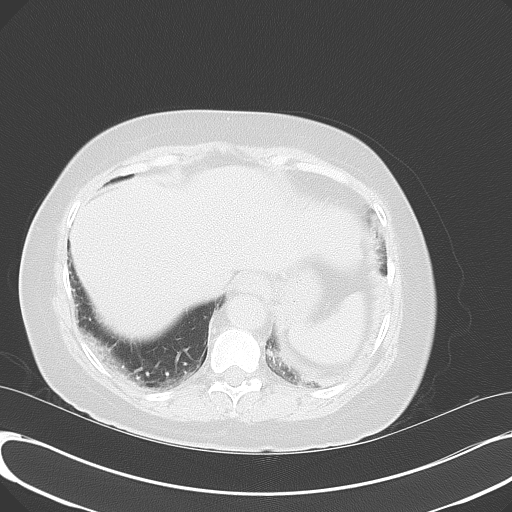
[im 11/19  soft-tissue]
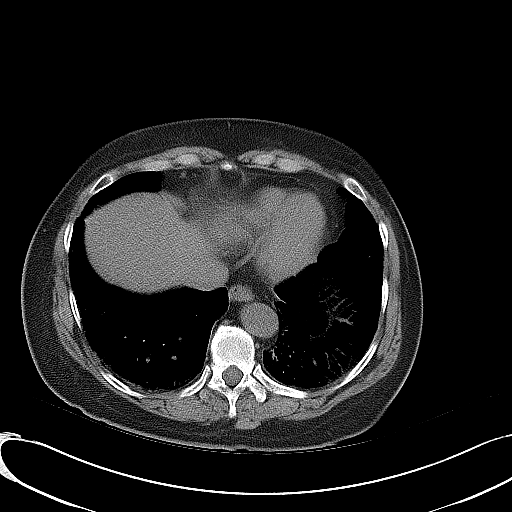
[im 11/19  lung]
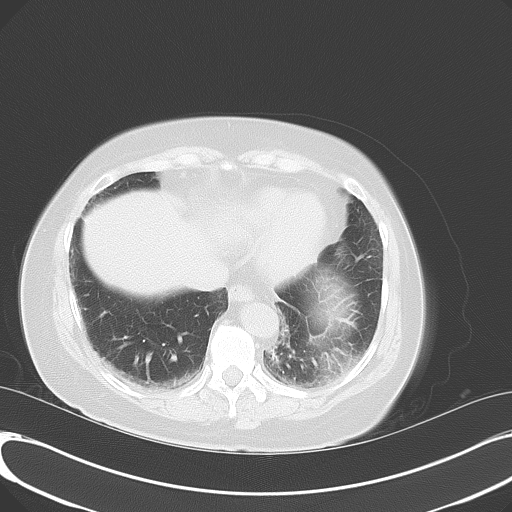
[im 15/19  soft-tissue]
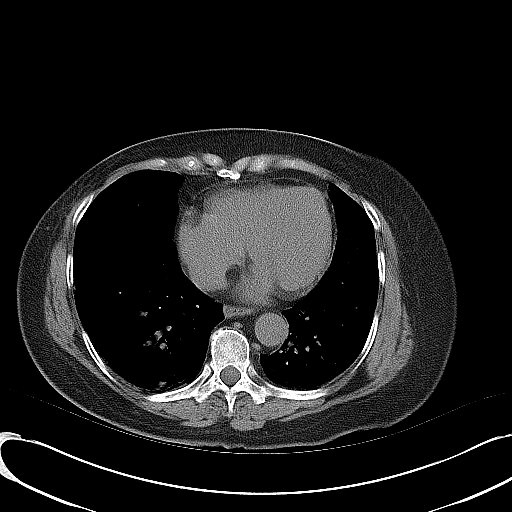
[im 15/19  lung]
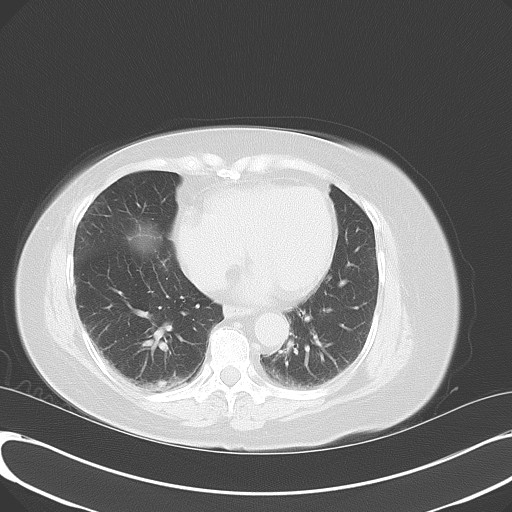

[Series 5: coronal · coronal · 0.71mm/px · 3 of 124 slices shown, 4 images]
[im 42/124  soft-tissue]
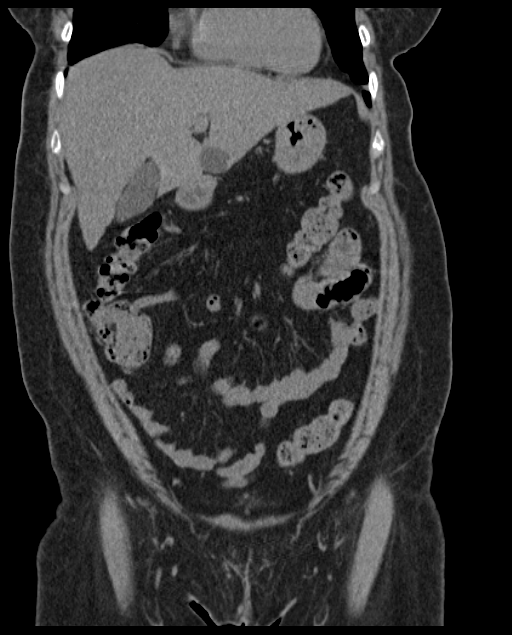
[im 55/124  soft-tissue]
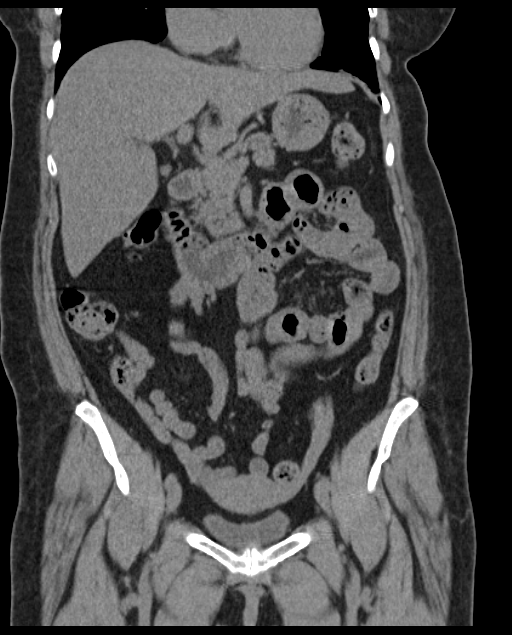
[im 55/124  bone]
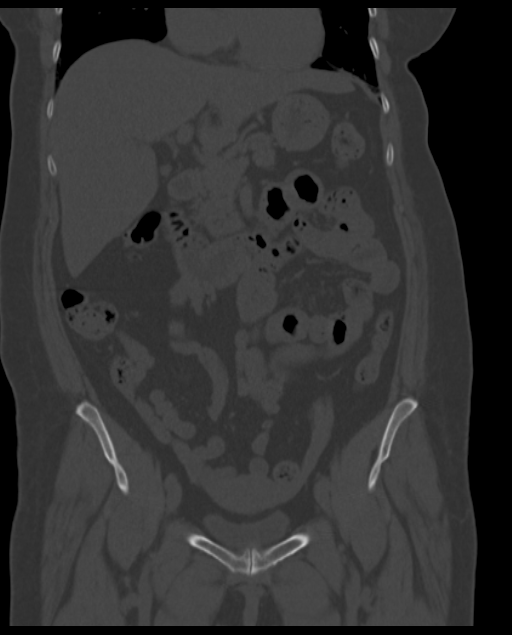
[im 69/124  soft-tissue]
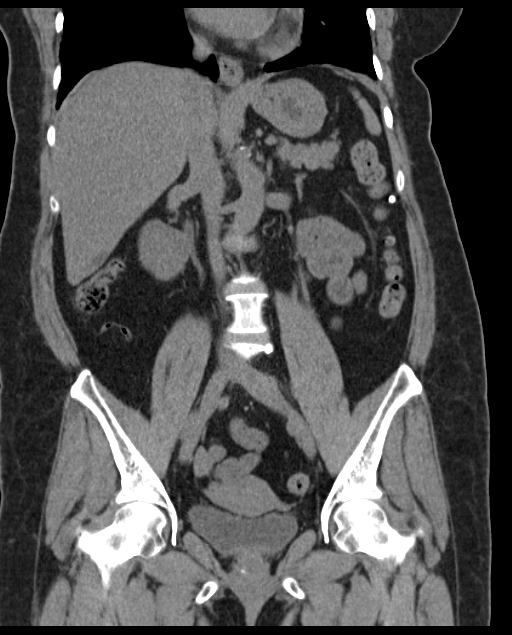

[Series 6: sagittal · sagittal · 0.57mm/px · 1 of 167 slices shown, 2 images]
[im 56/167  soft-tissue]
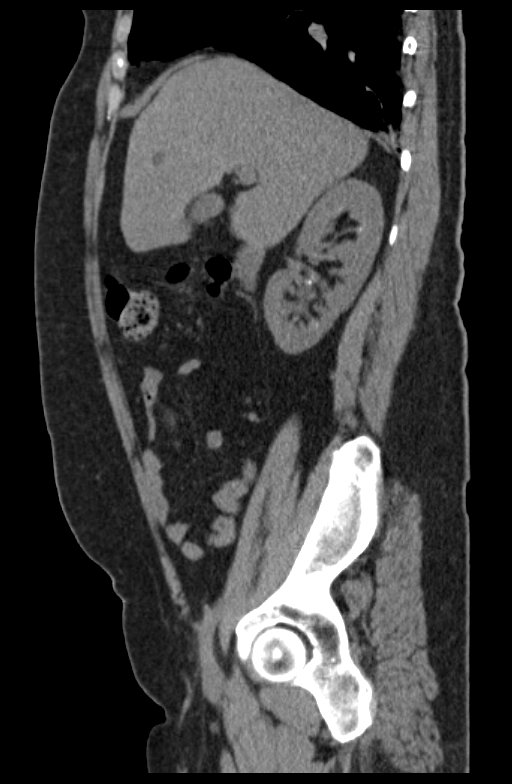
[im 56/167  bone]
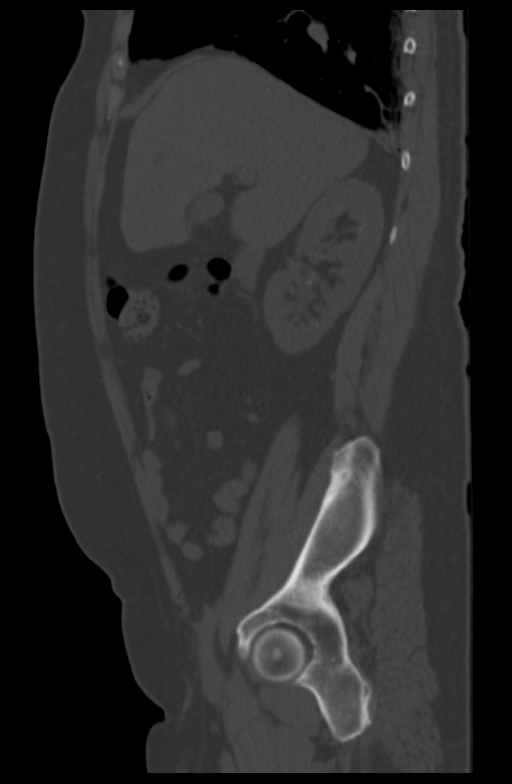

[8 of 46 positions shown; findings below may reference images not displayed]

FINDINGS: The lack of intravenous contrast limits the ability to evaluate
solid abdominal organs.

Lower chest: Limited visualization of the lower thorax is negative
for focal airspace opacity or pleural effusion. There is minimal
dependent subpleural ground-glass atelectasis. Note is made of an
approximately 0.6 cm nodule within the subpleural aspect of the
image right lower lobe (image 5, series 4). No pleural effusion.

Normal heart size.  No pericardial effusion.

Hepatobiliary: Normal hepatic contour. Note is made of several hypo
attenuating hepatic lesions with dominant bilobed lesion within the
subcapsular aspect of the lateral segment of the left lobe of the
liver measuring approximately 3.4 x 2.2 cm (image 22, series 2),
incompletely characterized on this noncontrast examination though
favored to represent hepatic cysts. Normal noncontrast appearance of
the gallbladder. No radiopaque gallstones. No ascites.

Pancreas: Normal noncontrast appearance of the pancreas

Spleen: Normal noncontrast appearance of the spleen

Adrenals/Urinary Tract: There are multiple punctate nonobstructing
right-sided renal stones with dominant nonobstructing stones within
the interpolar aspect of the right kidney measuring approximately 4
mm (representative coronal images 79 and 83, series 5). Less
well-defined punctate nonobstructing stones are seen throughout
nearly all the right-sided renal calices.

No evidence of left-sided nephrolithiasis. No renal stones are seen
along the expected course of either ureter or the urinary bladder.
Note is made of a punctate phlebolith within the right gonadal vein
(image 61, series 2). No urinary obstruction or perinephric
stranding.

Normal noncontrast appearance the bilateral adrenal glands.

Normal noncontrast appearance the urinary bladder given degree
distention.

Stomach/Bowel: Moderate colonic stool burden without evidence of
enteric obstruction. Scattered colonic diverticulosis without
evidence of diverticulitis. The bowel is otherwise normal in course
and caliber without wall thickening or evidence of enteric
obstruction. Normal noncontrast appearance of the terminal ileum and
appendix. No pneumoperitoneum, pneumatosis or portal venous gas.

Vascular/Lymphatic: Scattered atherosclerotic plaque within a
tortuous but normal caliber abdominal aorta.

No bulky retroperitoneal, mesenteric, pelvic or inguinal
lymphadenopathy on this noncontrast examination.

Reproductive: Normal noncontrast appearance of the pelvic organs. No
discrete adnexal lesion. No free fluid in the pelvic cul-de-sac.

Other: Regional soft tissues appear normal.

Musculoskeletal: No acute or aggressive osseous abnormalities.
Mild-to-moderate multilevel lumbar spine DDD, worse at L5-S1 with
disc space height loss, endplate irregularity and sclerosis.
IMPRESSION: 1. Nonobstructing right-sided nephrolithiasis. Otherwise, no
explanation for patient's right-sided flank pain.
2. Colonic diverticulosis without evidence of diverticulitis.
3. Indeterminate punctate (approximately 6 mm) nodule within the
subpleural aspect of the image right lower lobe. Non-contrast chest
CT at 6-12 months is recommended. If the nodule is stable at time of
repeat CT, then future CT at 18-24 months (from today's scan) is
considered optional for low-risk patients, but is recommended for
high-risk patients. This recommendation follows the consensus
statement: Guidelines for Management of Incidental Pulmonary Nodules
Detected on CT Images:From the [HOSPITAL] 9658; published
online before print (10.1148/radiol.[id number]).
4.  Aortic Atherosclerosis (LLOBL-170.0)

## 2018-05-16 ENCOUNTER — Other Ambulatory Visit: Payer: Self-pay

## 2018-05-16 ENCOUNTER — Emergency Department: Payer: Medicaid Other

## 2018-05-16 ENCOUNTER — Encounter: Payer: Self-pay | Admitting: *Deleted

## 2018-05-16 ENCOUNTER — Emergency Department
Admission: EM | Admit: 2018-05-16 | Discharge: 2018-05-16 | Disposition: A | Discharge status: Home / Self Care | Payer: Medicaid Other | Attending: Emergency Medicine | Admitting: Emergency Medicine

## 2018-05-16 DIAGNOSIS — Z853 Personal history of malignant neoplasm of breast: Secondary | ICD-10-CM | POA: Diagnosis not present

## 2018-05-16 DIAGNOSIS — I10 Essential (primary) hypertension: Secondary | ICD-10-CM | POA: Diagnosis not present

## 2018-05-16 DIAGNOSIS — Z79899 Other long term (current) drug therapy: Secondary | ICD-10-CM | POA: Diagnosis not present

## 2018-05-16 DIAGNOSIS — F17210 Nicotine dependence, cigarettes, uncomplicated: Secondary | ICD-10-CM | POA: Insufficient documentation

## 2018-05-16 DIAGNOSIS — M549 Dorsalgia, unspecified: Secondary | ICD-10-CM

## 2018-05-16 DIAGNOSIS — G8929 Other chronic pain: Secondary | ICD-10-CM | POA: Insufficient documentation

## 2018-05-16 LAB — CBC WITH DIFFERENTIAL/PLATELET
Basophils Absolute: 0.1 10*3/uL (ref 0–0.1)
Basophils Relative: 1 %
Eosinophils Absolute: 0.1 10*3/uL (ref 0–0.7)
Eosinophils Relative: 2 %
HEMATOCRIT: 45.2 % (ref 35.0–47.0)
HEMOGLOBIN: 15.5 g/dL (ref 12.0–16.0)
LYMPHS ABS: 2.4 10*3/uL (ref 1.0–3.6)
LYMPHS PCT: 32 %
MCH: 32.2 pg (ref 26.0–34.0)
MCHC: 34.3 g/dL (ref 32.0–36.0)
MCV: 93.9 fL (ref 80.0–100.0)
MONOS PCT: 8 %
Monocytes Absolute: 0.6 10*3/uL (ref 0.2–0.9)
NEUTROS ABS: 4.3 10*3/uL (ref 1.4–6.5)
NEUTROS PCT: 57 %
Platelets: 211 10*3/uL (ref 150–440)
RBC: 4.81 MIL/uL (ref 3.80–5.20)
RDW: 13 % (ref 11.5–14.5)
WBC: 7.6 10*3/uL (ref 3.6–11.0)

## 2018-05-16 LAB — COMPREHENSIVE METABOLIC PANEL
ALK PHOS: 73 U/L (ref 38–126)
ALT: 54 U/L — AB (ref 0–44)
ANION GAP: 7 (ref 5–15)
AST: 32 U/L (ref 15–41)
Albumin: 4 g/dL (ref 3.5–5.0)
BILIRUBIN TOTAL: 0.5 mg/dL (ref 0.3–1.2)
BUN: 17 mg/dL (ref 8–23)
CALCIUM: 9 mg/dL (ref 8.9–10.3)
CO2: 28 mmol/L (ref 22–32)
Chloride: 107 mmol/L (ref 98–111)
Creatinine, Ser: 0.98 mg/dL (ref 0.44–1.00)
GFR calc non Af Amer: >60 mL/min (ref >60)
GLUCOSE: 124 mg/dL — AB (ref 70–99)
Potassium: 3.3 mmol/L — ABNORMAL LOW (ref 3.5–5.1)
Sodium: 142 mmol/L (ref 135–145)
TOTAL PROTEIN: 7.2 g/dL (ref 6.5–8.1)

## 2018-05-16 LAB — TROPONIN I

## 2018-05-16 MED ORDER — ONDANSETRON HCL 4 MG/2ML IJ SOLN
4.0000 mg | INTRAMUSCULAR | Status: AC
  Administered 2018-05-16: 4 mg via INTRAVENOUS
  Filled 2018-05-16: qty 2

## 2018-05-16 MED ORDER — HYDROCODONE-ACETAMINOPHEN 5-325 MG PO TABS: 1.0000 | ORAL_TABLET | Freq: Four times a day (QID) | ORAL | 0 refills | Status: DC | PRN

## 2018-05-16 MED ORDER — DOCUSATE SODIUM 100 MG PO CAPS: ORAL_CAPSULE | ORAL | 0 refills | Status: DC

## 2018-05-16 MED ORDER — AMLODIPINE BESYLATE 5 MG PO TABS: 5.0000 mg | ORAL_TABLET | Freq: Every day | ORAL | 1 refills | Status: DC

## 2018-05-16 MED ORDER — MORPHINE SULFATE (PF) 4 MG/ML IV SOLN
4.0000 mg | Freq: Once | INTRAVENOUS | Status: AC
  Administered 2018-05-16: 4 mg via INTRAVENOUS
  Filled 2018-05-16: qty 1

## 2018-05-16 MED ORDER — HYDROCHLOROTHIAZIDE 25 MG PO TABS: 25.0000 mg | ORAL_TABLET | Freq: Every day | ORAL | 1 refills | Status: DC

## 2018-05-16 MED ORDER — AMLODIPINE BESYLATE 5 MG PO TABS
5.0000 mg | ORAL_TABLET | Freq: Once | ORAL | Status: AC
  Administered 2018-05-16: 5 mg via ORAL
  Filled 2018-05-16: qty 1

## 2018-05-16 MED ORDER — HYDROCHLOROTHIAZIDE 25 MG PO TABS
25.0000 mg | ORAL_TABLET | ORAL | Status: AC
  Administered 2018-05-16: 25 mg via ORAL
  Filled 2018-05-16: qty 1

## 2018-05-16 MED ORDER — OXYCODONE-ACETAMINOPHEN 5-325 MG PO TABS
1.0000 | ORAL_TABLET | Freq: Once | ORAL | Status: AC
  Administered 2018-05-16: 1 via ORAL
  Filled 2018-05-16: qty 1

## 2018-05-16 NOTE — ED Notes (Signed)
Patient transported to X-ray 

## 2018-05-16 NOTE — ED Notes (Signed)
Pt reports she has not had her BP medication in 4 days, states has had HA however states she hit her head when falling down steps 2 days ago. Pt A&Ox4 and in NAD at this time.

## 2018-05-16 NOTE — ED Triage Notes (Signed)
Pt fell down 8 stairs x 2 nights ago. Pt unable to see PCP due to insurance issues. Pt c/o midback pain going to the sacrum. Pt is extremely hypertensive in triage and reports being out of her HTN meds x 4 days. Pt also c/o headache that has been persistent. Pt did strike head when she fell down stairs.

## 2018-05-16 NOTE — ED Provider Notes (Signed)
[facility] Emergency Department Provider Note  ____________________________________________   First MD Initiated Contact with Patient 05/16/18 [phone]     (approximate)  I have reviewed the triage vital signs and the nursing notes.   HISTORY  Chief Complaint Back Pain   The patient and/or family speak(s) Spanish natively, but the patient also speaks [place] and prefers to speak with me directly in [place] and in [place].  They understand they have the right to the use of a hospital interpreter.  They know that they can ask for an interpreter at any time.   HPI Tricia Wilson is a 62 y.o. female with medical history as listed below who presents for evaluation of acute on chronic back pain.  She and her adult son who is at the bedside both provide history.  She is suffered from back problems for an extended period of time.  They feel that her primary care doctor is not providing adequate care and treatment.  She has tried tramadol but is not been working and they feel it is causing some hives.  She says that the pain in her back is making it hard for her to do her housework and to get around well, although she is still ambulatory it does cause pain to do so.  They describe the pain is severe and nothing in particular makes it better other than rest.  Her chronic back pain was worsened about 2 days ago when she slipped and fell down some stairs.  She struck her head and has a bump on her left forehead but it is improving.  She has no neck pain.  She has pain from the middle of her back on down to her sacrum.  She did not sustain any other injuries and did not lose consciousness.  She denies chest pain, shortness of breath, nausea, vomiting, and abdominal pain.  She has no numbness nor tingling or weakness in any of her extremities.  No dysuria or difficulty with urination.  Of note, she has been out of her blood pressure medications (amlodipine and HCTZ) for about 4  days.  Past Medical History:  Diagnosis Date  . Breast cancer [facility]) 2013   Right breast cancer - Radiation  . Breast lump 2014  . 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 Hypertension   . Kidney stone     Patient Active Problem List   Diagnosis Date Noted  . UTI (lower urinary tract infection) 02/03/2016  . Breast mass, right 08/12/2015  . DCIS (ductal carcinoma in situ) of breast 05/10/2013    Past Surgical History:  Procedure Laterality Date  . BREAST BIOPSY Right 08/12/2015   US guided biopsy w/ clip placement/SPINDLE CELL PROLIFERATION WITH CYTOLOGIC ATYPIA  . BREAST LUMPECTOMY Right 08/29/2015   Procedure: BREAST LUMPECTOMY;  Surgeon: Christene Lye, MD;  Location: [facility] ORS;  Service: General;  Laterality: Right;  . BREAST SURGERY Right 05-16-2013   lumpectomy  . SHOULDER ARTHROSCOPY Right 1996  . [place]   . WISDOM TOOTH EXTRACTION      Prior to Admission medications   Medication Sig Start Date End Date Taking? Authorizing Provider  amLODipine (NORVASC) 5 MG tablet Take 1 tablet (5 mg total) by mouth daily. In am 05/16/18 06/15/18  Hinda Kehr, MD  amoxicillin (AMOXIL) 500 MG capsule Take 1 capsule (500 mg total) by mouth 2 (two) times daily.  03/23/17   Laban Emperor, PA-C  cephALEXin (KEFLEX) 500 MG capsule Take 1 capsule (500 mg total) by mouth 2 (two) times daily. 05/05/16   Carrie Mew, MD  cyclobenzaprine (FLEXERIL) 10 MG tablet 1 tab QHS Patient taking differently: at bedtime. 1 tab QHS 07/15/15   Harvest Dark, PA-C  diazepam (VALIUM) 2 MG tablet Take 1 tablet (2 mg total) by mouth every 6 (six) hours as needed for anxiety. 05/30/17   Loney Hering, MD  dicyclomine (BENTYL) 10 MG capsule Take 10 mg by mouth 4 (four) times daily -  before meals and at bedtime. As needed.    [provider]  docusate sodium (COLACE) 100 MG capsule Take 1 tablet once or twice daily  as needed for constipation while taking narcotic pain medicine 05/16/18   Hinda Kehr, MD  hydrochlorothiazide (HYDRODIURIL) 25 MG tablet Take 1 tablet (25 mg total) by mouth daily. In am 05/16/18 06/15/18  Hinda Kehr, MD  HYDROcodone-acetaminophen (NORCO/VICODIN) 5-325 MG tablet Take 1-2 tablets by mouth every 6 (six) hours as needed for moderate pain. 05/16/18   Hinda Kehr, MD  lidocaine (LIDODERM) 5 % Place 1 patch onto the skin every 12 (twelve) hours. Remove & Discard patch within 12 hours or as directed by MD 05/30/17 05/30/18  Loney Hering, MD  naproxen (NAPROSYN) 500 MG tablet Take 1 tablet (500 mg total) by mouth 2 (two) times daily with a meal. 05/05/16   Carrie Mew, MD  nitrofurantoin, macrocrystal-monohydrate, (MACROBID) 100 MG capsule Take 1 capsule (100 mg total) by mouth 2 (two) times daily. 02/03/16   Defrancesco, Alanda Slim, MD  omeprazole (PRILOSEC) 20 MG capsule Take 20 mg by mouth daily. As needed.    [provider]  predniSONE (STERAPRED UNI-PAK 21 TAB) 10 MG (21) TBPK tablet Take 6 tablets on day 1, take 5 tablets on day 2, take 4 tablets on day 3, take 3 tablets on day 4, take 2 tablets on day 5, take 1 tablet on day 6 03/23/17   Laban Emperor, PA-C  promethazine (PHENERGAN) 25 MG tablet Take 1 tablet (25 mg total) by mouth every 6 (six) hours as needed for nausea or vomiting. 05/05/16   Carrie Mew, MD  traMADol (ULTRAM) 50 MG tablet Take 1 tablet (50 mg total) by mouth 3 (three) times daily as needed for pain. 08/01/13   Christene Lye, MD  traMADol (ULTRAM) 50 MG tablet Take 1 tablet (50 mg total) by mouth every 6 (six) hours as needed. 05/30/17   Loney Hering, MD    Allergies Morphine and related  Family History  Problem Relation Age of Onset  . Breast cancer Mother   . Cancer Paternal Aunt 19       small bowel  . Breast cancer Maternal Aunt 51  . Breast cancer Sister        younger than age 47  . Breast cancer Sister         younger than age 102  . Breast cancer Maternal Aunt 61  . Breast cancer Cousin 67  . Prostate cancer Brother 54  . Diabetes Paternal Grandmother   . Ovarian cancer Neg Hx     Social History Social History   Tobacco Use  . Smoking status: Current Every Day Smoker    Packs/day: 0.25    Years: 20.00    Pack years: 5.00    Types: Cigarettes  . Smokeless tobacco: Never Used  Substance Use Topics  . Alcohol use:  Yes    Alcohol/week: 0.0 - 0.6 oz    Comment: "occassional alcohol use"  . Drug use: No    Review of Systems Constitutional: No fever/chills Eyes: No visual changes. ENT: No sore throat. Cardiovascular: Denies chest pain. Respiratory: Denies shortness of breath. Gastrointestinal: No abdominal pain.  No nausea, no vomiting.  No diarrhea.  No constipation. Genitourinary: Negative for dysuria. Musculoskeletal: Acute on chronic back pain as described above Integumentary: Negative for rash. Neurological: Negative for headaches, focal weakness or numbness.   ____________________________________________   PHYSICAL EXAM:  VITAL SIGNS: ED Triage Vitals  Enc Vitals Group     BP 05/16/18 0300 (!) 229/126     Pulse Rate 05/16/18 0300 91     Resp 05/16/18 0300 18     Temp 05/16/18 0300 98.5 F (36.9 C)     Temp Source 05/16/18 0300 Oral     SpO2 05/16/18 0300 97 %     Weight 05/16/18 0301 64.4 kg (142 lb)     Height 05/16/18 0301 1.524 m (5')     Head Circumference --      Peak Flow --      Pain Score 05/16/18 0301 10     Pain Loc --      Pain Edu? --      Excl. in [place]? --     Constitutional: Alert and oriented. Well appearing and in no acute distress. Eyes: Conjunctivae are normal.  Head: Subacute hematoma to the left forehead Nose: No congestion/rhinnorhea. Mouth/Throat: Mucous membranes are moist. Neck: No stridor.  No meningeal signs.  No cervical spine tenderness to palpation Cardiovascular: Normal rate, regular rhythm. Good peripheral circulation. Grossly  normal heart sounds. Respiratory: Normal respiratory effort.  No retractions. Lungs CTAB. Gastrointestinal: Soft and nontender. No distention.  Musculoskeletal: Generalized tenderness to palpation of the back and the muscles of the back but no step-offs or deformities or tenderness to palpation along the spine. Neurologic:  Normal speech and language. No gross focal neurologic deficits are appreciated.  Skin:  Skin is warm, dry and intact. No rash noted. Psychiatric: Mood and affect are normal. Speech and behavior are normal.  ____________________________________________   LABS (all labs ordered are listed, but only abnormal results are displayed)  Labs Reviewed  COMPREHENSIVE METABOLIC PANEL - Abnormal; Notable for the following components:      Result Value   Potassium 3.3 (*)    Glucose, Bld 124 (*)    ALT 54 (*)    All other components within normal limits  CBC WITH DIFFERENTIAL/PLATELET  TROPONIN I   ____________________________________________  EKG  None - EKG not ordered by ED physician ____________________________________________  RADIOLOGY   ED MD interpretation: No indication of any acute abnormalities of the thoracic, lumbar, nor sacral spines.  She does have some degenerative disc disease in the lumbar spine  Official radiology report(s): Dg Thoracic Spine 2 View  Result Date: 05/16/2018 CLINICAL DATA:  Fall down stairs several days ago. Thoracic, lumbar, and sacral pain. EXAM: THORACIC SPINE 2 VIEWS COMPARISON:  None. FINDINGS: The alignment is maintained. Vertebral body heights are maintained. No evidence of fracture. Mild endplate spurring of the mid lower thoracic spine. Posterior elements appear intact. There is no paravertebral soft tissue abnormality. IMPRESSION: Negative for fracture of the thoracic spine. Electronically Signed   By: Jeb Levering M.D.   On: 05/16/2018 04:01   Dg Lumbar Spine 2-3 Views  Result Date: 05/16/2018 CLINICAL DATA:  Fall down  stairs several days ago. Thoracic,  lumbar, and sacral pain. EXAM: LUMBAR SPINE - 2-3 VIEW COMPARISON:  Abdominal CT 05/05/2016 FINDINGS: The alignment is maintained. Vertebral body heights are normal. There is no listhesis. The posterior elements are intact. Disc space narrowing at L2-L3 and L5-S1 with associated endplate spurring. No fracture. Sacroiliac joints are symmetric and normal. Right nephrolithiasis. IMPRESSION: 1. Negative for acute fracture of the lumbar spine. 2. 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 Electronically Signed   By: Jeb Levering M.D.   On: 05/16/2018 04:03   Dg Sacrum/coccyx  Result Date: 05/16/2018 CLINICAL DATA:  Fall down stairs several days ago. Thoracic, lumbar, and sacral pain. EXAM: SACRUM AND COCCYX - 2+ VIEW COMPARISON:  None. FINDINGS: There is no evidence of fracture or other focal bone lesions. The sacral ala are maintained. Sacroiliac joints are congruent. IMPRESSION: Negative radiographs of the sacrum and coccyx. Electronically Signed   By: Jeb Levering M.D.   On: 05/16/2018 04:05    ____________________________________________   PROCEDURES  Critical Care performed: No   Procedure(s) performed:   Procedures   ____________________________________________   INITIAL IMPRESSION / ASSESSMENT AND PLAN / ED COURSE  As part of my medical decision making, I reviewed the following data within the [place] History obtained from family, Nursing notes reviewed and incorporated, Labs reviewed , Old chart reviewed, Radiograph reviewed , Notes from prior ED visits and [place] Controlled Substance Database    Differential diagnosis includes, but is not limited to, acute spinal fracture, exacerbation of chronic degenerative disc disease or chronic back pain in general, musculoskeletal pain, spinal injury.  Fortunately patient is in no acute distress at this time and has a reassuring work-up and exam.  No evidence of fracture dislocation  on imaging and no concerning findings on physical exam.  Mostly it sounds like the patient and her family are frustrated with her primary care doctor and the lack of follow-up in terms of recommendations and additional evaluation or treatment.  I am providing information regarding the [facility] pain clinic as well as with Dr. Sharlet Salina (physiatry) who may be able to provide some assistance.  I checked the drug database and she has low risk for abuse, having not fill the prescription in about a year and that was for tramadol, so I am prescribing a short course of Norco, but I stressed that this is only a short-term solution and she should use over-the-counter medication as much as possible.  Her vital signs are stable except for severely increased blood pressure when she first arrived.  I think that this is multifactorial, likely due to nonadherence to her medication regimen as well as anxiety over being in the emergency department and acute pain.  After I treated her pain with morphine 4 mg IV and provided her home medications of amlodipine 5 mg by mouth and HCTZ 25 mg by mouth, her blood pressure came down nicely and appropriately.  She has no evidence of any endorgan dysfunction on her lab work which is generally reassuring and there is no indication for additional treatment or evaluation.   I gave my usual and customary return precautions.  She and the family understand and agree with the plan.     ____________________________________________  FINAL CLINICAL IMPRESSION(S) / ED DIAGNOSES  Final diagnoses:  Essential hypertension  Chronic bilateral back pain, unspecified back location     MEDICATIONS GIVEN DURING THIS VISIT:  Medications  hydrochlorothiazide (HYDRODIURIL) tablet 25 mg (25 mg Oral Given 05/16/18 0333)  amLODipine (NORVASC) tablet 5  mg (5 mg Oral Given 05/16/18 0333)  morphine 4 MG/ML injection 4 mg (4 mg Intravenous Given 05/16/18 0338)  ondansetron (ZOFRAN) injection 4 mg (4 mg  Intravenous Given 05/16/18 0338)  oxyCODONE-acetaminophen (PERCOCET/ROXICET) 5-325 MG per tablet 1 tablet (1 tablet Oral Given 05/16/18 0516)     ED Discharge Orders        Ordered    HYDROcodone-acetaminophen (NORCO/VICODIN) 5-325 MG tablet  Every 6 hours PRN     05/16/18 0454    docusate sodium (COLACE) 100 MG capsule     05/16/18 0454    hydrochlorothiazide (HYDRODIURIL) 25 MG tablet  Daily     05/16/18 0454    amLODipine (NORVASC) 5 MG tablet  Daily     05/16/18 0454       Note:  This document was prepared using Dragon voice recognition software and may include unintentional dictation errors.    Hinda Kehr, MD 05/16/18 [id number]

## 2018-05-16 NOTE — Discharge Instructions (Signed)
You have been seen in the Emergency Department (ED) today for a fall.  Your work up does not show any concerning injuries.  Please take over-the-counter ibuprofen and/or Tylenol as needed for your pain (unless you have an allergy or your doctor as told you not to take them), or take any prescribed medication as instructed.  Take Norco as prescribed for severe pain. Do not drink alcohol, drive or participate in any other potentially dangerous activities while taking this medication as it may make you sleepy. Do not take this medication with any other sedating medications, either prescription or over-the-counter. If you were prescribed Percocet or Vicodin, do not take these with acetaminophen (Tylenol) as it is already contained within these medications.   This medication is an opiate (or narcotic) pain medication and can be habit forming.  Use it as little as possible to achieve adequate pain control.  Do not use or use it with extreme caution if you have a history of opiate abuse or dependence.  If you are on a pain contract with your primary care doctor or a pain specialist, be sure to let them know you were prescribed this medication today from the [facility] Emergency Department.  This medication is intended for your use only - do not give any to anyone else and keep it in a secure place where nobody else, especially children, have access to it.  It will also cause or worsen constipation, so you may want to consider taking an over-the-counter stool softener while you are taking this medication.  Please follow up with your doctor regarding today's Emergency Department (ED) visit and your recent fall.    It is also important that you take your blood pressure medication.  I refilled your medications and sent them electronically to [facility].  Return to the ED if you have any headache, confusion, slurred speech, weakness/numbness of any arm or leg, or any increased pain.

## 2018-05-16 NOTE — ED Notes (Signed)

## 2018-06-28 ENCOUNTER — Encounter: Payer: Self-pay | Admitting: Emergency Medicine

## 2018-06-28 ENCOUNTER — Other Ambulatory Visit: Payer: Self-pay

## 2018-06-28 DIAGNOSIS — Z79899 Other long term (current) drug therapy: Secondary | ICD-10-CM | POA: Diagnosis not present

## 2018-06-28 DIAGNOSIS — F17210 Nicotine dependence, cigarettes, uncomplicated: Secondary | ICD-10-CM | POA: Diagnosis not present

## 2018-06-28 DIAGNOSIS — Z853 Personal history of malignant neoplasm of breast: Secondary | ICD-10-CM | POA: Diagnosis not present

## 2018-06-28 DIAGNOSIS — Z923 Personal history of irradiation: Secondary | ICD-10-CM | POA: Insufficient documentation

## 2018-06-28 DIAGNOSIS — G8929 Other chronic pain: Secondary | ICD-10-CM | POA: Insufficient documentation

## 2018-06-28 DIAGNOSIS — M545 Low back pain: Secondary | ICD-10-CM | POA: Diagnosis not present

## 2018-06-28 DIAGNOSIS — I10 Essential (primary) hypertension: Secondary | ICD-10-CM | POA: Insufficient documentation

## 2018-06-28 NOTE — ED Triage Notes (Signed)
Patient ambulatory to triage with steady gait, without difficulty or distress noted; pt reports lower back pain "for long time", st trying to get disability

## 2018-06-29 ENCOUNTER — Emergency Department
Admission: EM | Admit: 2018-06-29 | Discharge: 2018-06-29 | Disposition: A | Discharge status: Home / Self Care | Payer: Medicaid Other | Attending: Emergency Medicine | Admitting: Emergency Medicine

## 2018-06-29 DIAGNOSIS — M545 Low back pain: Secondary | ICD-10-CM

## 2018-06-29 DIAGNOSIS — G8929 Other chronic pain: Secondary | ICD-10-CM

## 2018-06-29 MED ORDER — GABAPENTIN 300 MG PO CAPS
900.0000 mg | ORAL_CAPSULE | Freq: Once | ORAL | Status: AC
  Administered 2018-06-29: 900 mg via ORAL
  Filled 2018-06-29: qty 3

## 2018-06-29 MED ORDER — GABAPENTIN 300 MG PO CAPS: 300.0000 mg | ORAL_CAPSULE | Freq: Three times a day (TID) | ORAL | 0 refills | Status: AC

## 2018-06-29 MED ORDER — LIDOCAINE 5 % EX PTCH
1.0000 | MEDICATED_PATCH | Freq: Once | CUTANEOUS | Status: DC
  Administered 2018-06-29: 1 via TRANSDERMAL
  Filled 2018-06-29: qty 1

## 2018-06-29 MED ORDER — LIDOCAINE 5 % EX PTCH: 1.0000 | MEDICATED_PATCH | Freq: Two times a day (BID) | CUTANEOUS | 0 refills | Status: DC

## 2018-06-29 NOTE — ED Provider Notes (Signed)
[facility] Emergency Department Provider Note  ____________________________________________   First MD Initiated Contact with Patient 06/29/18 0004     (approximate)  I have reviewed the triage vital signs and the nursing notes.   HISTORY  Chief Complaint Back Pain   HPI Tricia Wilson is a 62 y.o. female who comes to the emergency department with an acute exacerbation of her chronic low back pain.  She says her pain is been for multiple years.  She has had an MRI  2 years ago showing or cord compression.  Her pain is moderate to severe right greater than left low back.  No difficulty initiating urination or defecation.  No numbness or weakness.  It feels normal to wipe.  She comes to the emergency department today because she is been unable to get disability and her pain is now severe.   Past Medical History:  Diagnosis Date  . Breast cancer [facility]) 2013   Right breast cancer - Radiation  . Breast lump 2014  . 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 Hypertension   . Kidney stone     Patient Active Problem List   Diagnosis Date Noted  . UTI (lower urinary tract infection) 02/03/2016  . Breast mass, right 08/12/2015  . DCIS (ductal carcinoma in situ) of breast 05/10/2013    Past Surgical History:  Procedure Laterality Date  . BREAST BIOPSY Right 08/12/2015   US guided biopsy w/ clip placement/SPINDLE CELL PROLIFERATION WITH CYTOLOGIC ATYPIA  . BREAST LUMPECTOMY Right 08/29/2015   Procedure: BREAST LUMPECTOMY;  Surgeon: Christene Lye, MD;  Location: [facility] ORS;  Service: General;  Laterality: Right;  . BREAST SURGERY Right 05-16-2013   lumpectomy  . SHOULDER ARTHROSCOPY Right 1996  . [place]   . WISDOM TOOTH EXTRACTION      Prior to Admission medications   Medication Sig Start Date End Date Taking? Authorizing Provider  amLODipine (NORVASC) 5 MG tablet Take 1  tablet (5 mg total) by mouth daily. In am 05/16/18 06/15/18  Hinda Kehr, MD  amoxicillin (AMOXIL) 500 MG capsule Take 1 capsule (500 mg total) by mouth 2 (two) times daily. 03/23/17   Laban Emperor, PA-C  cephALEXin (KEFLEX) 500 MG capsule Take 1 capsule (500 mg total) by mouth 2 (two) times daily. 05/05/16   Carrie Mew, MD  cyclobenzaprine (FLEXERIL) 10 MG tablet 1 tab QHS Patient taking differently: at bedtime. 1 tab QHS 07/15/15   Harvest Dark, PA-C  diazepam (VALIUM) 2 MG tablet Take 1 tablet (2 mg total) by mouth every 6 (six) hours as needed for anxiety. 05/30/17   Loney Hering, MD  dicyclomine (BENTYL) 10 MG capsule Take 10 mg by mouth 4 (four) times daily -  before meals and at bedtime. As needed.    [provider]  docusate sodium (COLACE) 100 MG capsule Take 1 tablet once or twice daily as needed for constipation while taking narcotic pain medicine 05/16/18   Hinda Kehr, MD  gabapentin (NEURONTIN) 300 MG capsule Take 1 capsule (300 mg total) by mouth 3 (three) times daily. 06/29/18 06/29/19  Darel Hong, MD  hydrochlorothiazide (HYDRODIURIL) 25 MG tablet Take 1 tablet (25 mg total) by mouth daily. In am 05/16/18 06/15/18  Hinda Kehr, MD  HYDROcodone-acetaminophen (NORCO/VICODIN) 5-325 MG tablet Take 1-2 tablets by mouth every 6 (six) hours as needed for moderate pain. 05/16/18  Hinda Kehr, MD  lidocaine (LIDODERM) 5 % Place 1 patch onto the skin every 12 (twelve) hours. Remove & Discard patch within 12 hours or as directed by MD 06/29/18 06/29/19  Darel Hong, MD  naproxen (NAPROSYN) 500 MG tablet Take 1 tablet (500 mg total) by mouth 2 (two) times daily with a meal. 05/05/16   Carrie Mew, MD  nitrofurantoin, macrocrystal-monohydrate, (MACROBID) 100 MG capsule Take 1 capsule (100 mg total) by mouth 2 (two) times daily. 02/03/16   Defrancesco, Alanda Slim, MD  omeprazole (PRILOSEC) 20 MG capsule Take 20 mg by mouth daily. As needed.    [provider]  predniSONE (STERAPRED UNI-PAK 21 TAB) 10 MG (21) TBPK tablet Take 6 tablets on day 1, take 5 tablets on day 2, take 4 tablets on day 3, take 3 tablets on day 4, take 2 tablets on day 5, take 1 tablet on day 6 03/23/17   Laban Emperor, PA-C  promethazine (PHENERGAN) 25 MG tablet Take 1 tablet (25 mg total) by mouth every 6 (six) hours as needed for nausea or vomiting. 05/05/16   Carrie Mew, MD  traMADol (ULTRAM) 50 MG tablet Take 1 tablet (50 mg total) by mouth 3 (three) times daily as needed for pain. 08/01/13   Christene Lye, MD  traMADol (ULTRAM) 50 MG tablet Take 1 tablet (50 mg total) by mouth every 6 (six) hours as needed. 05/30/17   Loney Hering, MD    Allergies Hydrocodone; Morphine and related; and Motrin [ibuprofen]  Family History  Problem Relation Age of Onset  . Breast cancer Mother   . Cancer Paternal Aunt 53       small bowel  . Breast cancer Maternal Aunt 56  . Breast cancer Sister        younger than age 7  . Breast cancer Sister        younger than age 3  . Breast cancer Maternal Aunt 38  . Breast cancer Cousin 63  . Prostate cancer Brother 45  . Diabetes Paternal Grandmother   . Ovarian cancer Neg Hx     Social History Social History   Tobacco Use  . Smoking status: Current Every Day Smoker    Packs/day: 0.25    Years: 20.00    Pack years: 5.00    Types: Cigarettes  . Smokeless tobacco: Never Used  Substance Use Topics  . Alcohol use: Yes    Alcohol/week: 0.0 - 1.0 standard drinks    Comment: "occassional alcohol use"  . Drug use: No    Review of Systems Constitutional: No fever/chills Eyes: No visual changes. ENT: No sore throat. Cardiovascular: Denies chest pain. Respiratory: Denies shortness of breath. Gastrointestinal: No abdominal pain.  No nausea, no vomiting.  No diarrhea.  No constipation. Genitourinary: Negative for dysuria. Musculoskeletal: Positive for back pain. Skin: Negative for rash. Neurological: Negative  for headaches, focal weakness or numbness.   ____________________________________________   PHYSICAL EXAM:  VITAL SIGNS: ED Triage Vitals  Enc Vitals Group     BP 06/28/18 2303 (!) 184/100     Pulse Rate 06/28/18 2303 88     Resp 06/28/18 2303 17     Temp 06/28/18 2303 98.9 F (37.2 C)     Temp Source 06/28/18 2303 Oral     SpO2 06/28/18 2303 96 %     Weight 06/28/18 2253 145 lb (65.8 kg)     Height 06/28/18 2253 5' (1.524 m)     Head Circumference --  Peak Flow --      Pain Score 06/28/18 2253 9     Pain Loc --      Pain Edu? --      Excl. in [place]? --     Constitutional: Alert and oriented x4 quite uncomfortable appearing although nontoxic no diaphoresis and speaks in full clear sentences Eyes: PERRL EOMI. Head: Atraumatic. Nose: No congestion/rhinnorhea. Mouth/Throat: No trismus Neck: No stridor.   Cardiovascular: Normal rate, regular rhythm. Grossly normal heart sounds.  Good peripheral circulation. Respiratory: Normal respiratory effort.  No retractions. Lungs CTAB and moving good air Gastrointestinal: Soft nontender Musculoskeletal: No midline back tenderness.  5 out of 5 hip flexion hip extension plantar flexion dorsiflexion 2+ DTRs no ankle clonus Neurologic:  Normal speech and language. No gross focal neurologic deficits are appreciated. Skin:  Skin is warm, dry and intact. No rash noted. Psychiatric: Mood and affect are normal. Speech and behavior are normal.    ____________________________________________   DIFFERENTIAL includes but not limited to  Cauda equina, musculoskeletal pain, radicular pain, back fracture ____________________________________________   LABS (all labs ordered are listed, but only abnormal results are displayed)  Labs Reviewed - No data to  display   __________________________________________  EKG   ____________________________________________  RADIOLOGY   ____________________________________________   PROCEDURES  Procedure(s) performed: no  Procedures  Critical Care performed: no  ____________________________________________   INITIAL IMPRESSION / ASSESSMENT AND PLAN / ED COURSE  Pertinent labs & imaging results that were available during my care of the patient were reviewed by me and considered in my medical decision making (see chart for details).   As part of my medical decision making, I reviewed the following data within the [place] History obtained from family if available, nursing notes, old chart and ekg, as well as notes from prior ED visits.  The patient arrives neurovascularly intact with acute exacerbation of her chronic low back pain.  She has had a previous MRI showing no lesions and no cauda equina.  Given Neurontin here with some improvement in her symptoms.  I will prescribe her Lidoderm and Neurontin for home and refer her to pain clinic.     -----------------------------------------    FINAL CLINICAL IMPRESSION(S) / ED DIAGNOSES  Final diagnoses:  Chronic right-sided low back pain without sciatica      NEW MEDICATIONS STARTED DURING THIS VISIT:  Discharge Medication List as of 06/29/2018 12:18 AM    START taking these medications   Details  gabapentin (NEURONTIN) 300 MG capsule Take 1 capsule (300 mg total) by mouth 3 (three) times daily., Starting Thu 06/29/2018, Until Fri 06/29/2019, Print    lidocaine (LIDODERM) 5 % Place 1 patch onto the skin every 12 (twelve) hours. Remove & Discard patch within 12 hours or as directed by MD, Starting Thu 06/29/2018, Until Fri 06/29/2019, Print         Note:  This document was prepared using Dragon voice recognition software and may include unintentional dictation errors.     Darel Hong, MD 06/30/18 1026

## 2018-06-29 NOTE — Discharge Instructions (Signed)
It is critically important that you establish care with both your primary care physician and back at the pain clinic for management of your chronic issues.  Return to the emergency department for any concerns.  It was a pleasure to take care of you today, and thank you for coming to our emergency department.  If you have any questions or concerns before leaving please ask the nurse to grab me and I'm more than happy to go through your aftercare instructions again.  If you were prescribed any opioid pain medication today such as Norco, Vicodin, Percocet, morphine, hydrocodone, or oxycodone please make sure you do not drive when you are taking this medication as it can alter your ability to drive safely.  If you have any concerns once you are home that you are not improving or are in fact getting worse before you can make it to your follow-up appointment, please do not hesitate to call 911 and come back for further evaluation.  Darel Hong, MD

## 2018-08-03 ENCOUNTER — Encounter: Payer: Self-pay | Admitting: Nurse Practitioner

## 2018-08-03 ENCOUNTER — Ambulatory Visit: Payer: Medicaid Other | Attending: Nurse Practitioner | Admitting: Nurse Practitioner

## 2018-08-03 ENCOUNTER — Other Ambulatory Visit: Payer: Self-pay

## 2018-08-03 VITALS — BP 167/116 | HR 84 | Temp 98.4°F | Resp 18 | Ht 60.0 in | Wt 145.0 lb

## 2018-08-03 DIAGNOSIS — F17210 Nicotine dependence, cigarettes, uncomplicated: Secondary | ICD-10-CM | POA: Insufficient documentation

## 2018-08-03 DIAGNOSIS — G894 Chronic pain syndrome: Secondary | ICD-10-CM | POA: Insufficient documentation

## 2018-08-03 DIAGNOSIS — R531 Weakness: Secondary | ICD-10-CM | POA: Insufficient documentation

## 2018-08-03 DIAGNOSIS — M533 Sacrococcygeal disorders, not elsewhere classified: Secondary | ICD-10-CM

## 2018-08-03 DIAGNOSIS — C50911 Malignant neoplasm of unspecified site of right female breast: Secondary | ICD-10-CM | POA: Insufficient documentation

## 2018-08-03 DIAGNOSIS — R29898 Other symptoms and signs involving the musculoskeletal system: Secondary | ICD-10-CM | POA: Insufficient documentation

## 2018-08-03 DIAGNOSIS — M47816 Spondylosis without myelopathy or radiculopathy, lumbar region: Secondary | ICD-10-CM | POA: Insufficient documentation

## 2018-08-03 DIAGNOSIS — M899 Disorder of bone, unspecified: Secondary | ICD-10-CM

## 2018-08-03 DIAGNOSIS — G8929 Other chronic pain: Secondary | ICD-10-CM | POA: Insufficient documentation

## 2018-08-03 DIAGNOSIS — M79604 Pain in right leg: Secondary | ICD-10-CM

## 2018-08-03 DIAGNOSIS — M5441 Lumbago with sciatica, right side: Secondary | ICD-10-CM | POA: Diagnosis not present

## 2018-08-03 DIAGNOSIS — Z79899 Other long term (current) drug therapy: Secondary | ICD-10-CM

## 2018-08-03 DIAGNOSIS — Z789 Other specified health status: Secondary | ICD-10-CM

## 2018-08-03 NOTE — Progress Notes (Signed)
Patient's Name: Tricia Wilson  MRN: [id number]  Referring Provider: Hinda Kehr, MD  DOB: Aug 07, 1956  PCP: No primary care provider on file.  DOS: 08/03/2018  Note by: Dionisio David NP  Service setting: Ambulatory outpatient  Specialty: Interventional Pain Management  Location: [facility] (AMB) Pain Management Facility    Patient type: New Patient    Primary Reason(s) for Visit: Initial Patient Evaluation CC: Back Pain (low); Knee Pain (right); and Foot Pain (bilateral pain and swelling )  HPI  Ms. Nairn is a 62 y.o. year old, female patient, who comes today for an initial evaluation. She has DCIS (ductal carcinoma in situ) of breast; Breast mass, right; UTI (lower urinary tract infection); Chronic bilateral low back pain with right-sided sciatica (Primary Area of Pain) (R>L); Chronic pain of right lower extremity (Secondary Area of Pain); Chronic left sacroiliac joint pain; Chronic pain syndrome; Pharmacologic therapy; Disorder of skeletal system; Problems influencing health status; and Lower extremity weakness (bilateral) on their problem list.. Her primarily concern today is the Back Pain (low); Knee Pain (right); and Foot Pain (bilateral pain and swelling )  Pain Assessment: Location: Lower Back Radiating: right knee and feet Onset: More than a month ago Duration: Chronic pain Quality: Sharp, Constant, Squeezing, Burning Severity: 10-Worst pain ever/10 (subjective, self-reported pain score)  Note: Reported level is compatible with observation. Clinically the patient looks like a 2/10 A 2/10 is viewed as "Mild to Moderate" and described as noticeable and distracting. Impossible to hide from other people. More frequent flare-ups. Still possible to adapt and function close to normal. It can be very annoying and may have occasional stronger flare-ups. With discipline, patients may get used to it and adapt. Information on the proper use of the pain scale provided to the patient today. When using our  objective Pain Scale, levels between 6 and 10/10 are said to belong in an emergency room, as it progressively worsens from a 6/10, described as severely limiting, requiring emergency care not usually available at an outpatient pain management facility. At a 6/10 level, communication becomes difficult and requires great effort. Assistance to reach the emergency department may be required. Facial flushing and profuse sweating along with potentially dangerous increases in heart rate and blood pressure will be evident. Effect on ADL:   Timing: Constant Modifying factors: hydrocodone but it caused hives, rest, hot baths BP: (!) 167/116  HR: 84  Onset and Duration: Date of onset: "months" Cause of pain: "accident at home" Severity: Getting worse Timing: Morning, Noon and Afternoon Aggravating Factors: Bending, Kneeling, Lifiting, Motion, Prolonged standing, Squatting, Stooping , Walking, Walking uphill, Walking downhill and Working Alleviating Factors: Resting, Sleeping and Warm showers or baths Associated Problems: Depression, Dizziness, Fatigue, Inability to concentrate, Weakness, Pain that wakes patient up and Pain that does not allow patient to sleep Quality of Pain: Aching, Agonizing, Annoying, Cruel, Distressing, Dreadful, Exhausting, Getting longer, Horrible, Nagging, Pressure-like, Sharp, Splitting and Uncomfortable Previous Examinations or Tests: Biopsy and CT scan Previous Treatments: Narcotic medications and Relaxation therapy  The patient comes into the clinics today for the first time for a chronic pain management evaluation.  According to the patient and her son primary area of pain is in her middle back.  She feels like the right is worse than the left.  [place]s that she suffered a fall approximately 1 year ago and then the pain got worse after his second recent fall.  She denies any previous surgery, interventional therapy or physical therapy.  She has had recent  images.  Her second area  of pain is in her legs.  She admits that is on the right.  He admits that the pain goes to the front of her legs down to her knee.  She has numbness and weakness.  The denies a previous nerve conduction study.  Today I took the time to provide the patient with information regarding this pain practice. The patient was informed that the practice is divided into two sections: an interventional pain management section, as well as a completely separate and distinct medication management section. I explained that there are procedure days for interventional therapies, and evaluation days for follow-ups and medication management. Because of the amount of documentation required during both, they are kept separated. This means that there is the possibility that he may be scheduled for a procedure on one day, and medication management the next. I have also informed her that because of staffing and facility limitations, this practice will no longer take patients for medication management only. To illustrate the reasons for this, I gave the patient the example of surgeons, and how inappropriate it would be to refer a patient to his/her care, just to write for the post-surgical antibiotics on a surgery done by a different surgeon.   Because interventional pain management is part of the board-certified specialty for the doctors, the patient was informed that joining this practice means that they are open to any and all interventional therapies. I made it clear that this does not mean that they will be forced to have any procedures done. What this means is that I believe interventional therapies to be essential part of the diagnosis and proper management of chronic pain conditions. Therefore, patients not interested in these interventional alternatives will be better served under the care of a different practitioner.  The patient was also made aware of my Comprehensive Pain Management Safety Guidelines where by joining this  practice, they limit all of their nerve blocks and joint injections to those done by our practice, for as long as we are retained to manage their care. Historic Controlled Substance Pharmacotherapy Review  PMP and historical list of controlled substances: Hydrocodone/acetaminophen 5/325, diazepam 2 mg, tramadol 50 mg Highest opioid analgesic regimen found: Hydrocodone/acetaminophen 5/325 mg 1-2 tablet 3 to 4 hours for 2 days ( fill date 05/16/2018) hydrocodone 7.5 mg/day Most recent opioid analgesic: Hydrocodone/acetaminophen 5/325 mg 1-2 tablet 3 to 4 hours for 2 days ( fill date 05/16/2018) hydrocodone 7.5 mg/day Current opioid analgesics: None Highest recorded MME/day: 7.5 mg/day MME/day: 0 mg/day Medications: The patient did not bring the medication(s) to the appointment, as requested in our "New Patient Package" Pharmacodynamics: Desired effects: Analgesia: The patient reports >50% benefit. Reported improvement in function: The patient reports medication allows her to accomplish basic ADLs. Clinically meaningful improvement in function (CMIF): Sustained CMIF goals met Perceived effectiveness: Described as relatively effective, allowing for increase in activities of daily living (ADL) Undesirable effects: Side-effects or Adverse reactions: None reported Historical Monitoring: The patient  reports that she does not use drugs. List of all UDS Test(s): No results found for: MDMA, COCAINSCRNUR, PCPSCRNUR, PCPQUANT, CANNABQUANT, THCU, [place] List of all Serum Drug Screening Test(s):  No results found for: AMPHSCRSER, BARBSCRSER, BENZOSCRSER, COCAINSCRSER, PCPSCRSER, PCPQUANT, THCSCRSER, CANNABQUANT, OPIATESCRSER, OXYSCRSER, PROPOXSCRSER Historical Background Evaluation: [place] PDMP: Six (6) year initial data search conducted.             [place] Department of public safety, offender search: Editor, commissioning Information) Non-contributory Risk Assessment Profile: Aberrant behavior:  None observed or detected  today Risk factors for fatal opioid overdose: None identified today Fatal overdose hazard ratio (HR): Calculation deferred Non-fatal overdose hazard ratio (HR): Calculation deferred Risk of opioid abuse or dependence: 0.7-3.0% with doses ? 36 MME/day and 6.1-26% with doses ? 120 MME/day. Substance use disorder (SUD) risk level: Pending results of Medical Psychology Evaluation for SUD Opioid risk tool (ORT) (Total Score): 2  ORT Scoring interpretation table:  Score <3 = Low Risk for SUD  Score between 4-7 = Moderate Risk for SUD  Score >8 = High Risk for Opioid Abuse   PHQ-2 Depression Scale:  Total score: 0  PHQ-2 Scoring interpretation table: (Score and probability of major depressive disorder)  Score 0 = No depression  Score 1 = 15.4% Probability  Score 2 = 21.1% Probability  Score 3 = 38.4% Probability  Score 4 = 45.5% Probability  Score 5 = 56.4% Probability  Score 6 = 78.6% Probability   PHQ-9 Depression Scale:  Total score: 0  PHQ-9 Scoring interpretation table:  Score 0-4 = No depression  Score 5-9 = Mild depression  Score 10-14 = Moderate depression  Score 15-19 = Moderately severe depression  Score 20-27 = Severe depression (2.4 times higher risk of SUD and 2.89 times higher risk of overuse)   Pharmacologic Plan: Pending ordered tests and/or consults  Meds  The patient has a current medication list which includes the following prescription(s): amlodipine, gabapentin, and omeprazole.  Current Outpatient Medications on File Prior to Visit  Medication Sig  . amLODipine (NORVASC) 5 MG tablet Take 1 tablet (5 mg total) by mouth daily. In am  . gabapentin (NEURONTIN) 300 MG capsule Take 1 capsule (300 mg total) by mouth 3 (three) times daily.  Marland Kitchen omeprazole (PRILOSEC) 20 MG capsule Take 20 mg by mouth daily. As needed.   No current facility-administered medications on file prior to visit.    Imaging Review  Thoracic Imaging:  Thoracic DG 2-3 views:  Results for  orders placed during the hospital encounter of 05/16/18  DG Thoracic Spine 2 View   Narrative CLINICAL DATA:  Fall down stairs several days ago. Thoracic, lumbar, and sacral pain.  EXAM: THORACIC SPINE 2 VIEWS  COMPARISON:  None.  FINDINGS: The alignment is maintained. Vertebral body heights are maintained. No evidence of fracture. Mild endplate spurring of the mid lower thoracic spine. Posterior elements appear intact. There is no paravertebral soft tissue abnormality.  IMPRESSION: Negative for fracture of the thoracic spine.   Electronically Signed   By: Jeb Levering M.D.   On: 05/16/2018 04:01     Lumbosacral Imaging:  Results for orders placed during the hospital encounter of 05/16/18  DG Lumbar Spine 2-3 Views   Narrative CLINICAL DATA:  Fall down stairs several days ago. Thoracic, lumbar, and sacral pain.  EXAM: LUMBAR SPINE - 2-3 VIEW  COMPARISON:  Abdominal CT 05/05/2016  FINDINGS: The alignment is maintained. Vertebral body heights are normal. There is no listhesis. The posterior elements are intact. Disc space narrowing at L2-L3 and L5-S1 with associated endplate spurring. No fracture. Sacroiliac joints are symmetric and normal. Right nephrolithiasis.  IMPRESSION: 1. Negative for acute fracture of the lumbar spine. 2. 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   Electronically Signed   By: Jeb Levering M.D.   On: 05/16/2018 04:03     Note: Available results from prior imaging studies were reviewed.        ROS  Cardiovascular History: High blood pressure Pulmonary or  Respiratory History: Smoking Neurological History: No reported neurological signs or symptoms such as seizures, abnormal skin sensations, urinary and/or fecal incontinence, being born with an abnormal open spine and/or a tethered spinal cord Review of Past Neurological Studies: No results found for this or any previous visit. Psychological-Psychiatric History:  Depressed Gastrointestinal History: Vomiting blood (Ulcers) Genitourinary History: Passing kidney stones Hematological History: No reported hematological signs or symptoms such as prolonged bleeding, low or poor functioning platelets, bruising or bleeding easily, hereditary bleeding problems, low energy levels due to low hemoglobin or being anemic Endocrine History: No reported endocrine signs or symptoms such as high or low blood sugar, rapid heart rate due to high thyroid levels, obesity or weight gain due to slow thyroid or thyroid disease Rheumatologic History: No reported rheumatological signs and symptoms such as fatigue, joint pain, tenderness, swelling, redness, heat, stiffness, decreased range of motion, with or without associated rash Musculoskeletal History: Negative for myasthenia gravis, muscular dystrophy, multiple sclerosis or malignant hyperthermia Work History: Out of work due to pain  Allergies  Ms. Benjamin is allergic to hydrocodone; morphine and related; and motrin [ibuprofen].  Laboratory Chemistry  Inflammation Markers No results found for: CRP, ESRSEDRATE (CRP: Acute Phase) (ESR: Chronic Phase) Renal Function Markers Lab Results  Component Value Date   BUN 17 05/16/2018   CREATININE 0.98 05/16/2018   GFRAA >60 05/16/2018   GFRNONAA >60 05/16/2018   Hepatic Function Markers Lab Results  Component Value Date   AST 32 05/16/2018   ALT 54 (H) 05/16/2018   ALBUMIN 4.0 05/16/2018   ALKPHOS 73 05/16/2018   Electrolytes Lab Results  Component Value Date   NA 142 05/16/2018   K 3.3 (L) 05/16/2018   CL 107 05/16/2018   CALCIUM 9.0 05/16/2018   Neuropathy Markers No results found for: [id number] Bone Pathology Markers Lab Results  Component Value Date   ALKPHOS 73 05/16/2018   CALCIUM 9.0 05/16/2018   Coagulation Parameters Lab Results  Component Value Date   PLT 211 05/16/2018   Cardiovascular Markers Lab Results  Component Value Date   HGB 15.5  05/16/2018   HCT 45.2 05/16/2018   Note: Lab results reviewed.  [place]  Drug: Ms. Abbett  reports that she does not use drugs. Alcohol:  reports that she drinks alcohol. Tobacco:  reports that she has been smoking cigarettes. She has a 5.00 pack-year smoking history. She has never used smokeless tobacco. Medical:  has a past medical history of Breast cancer ([place]) (2013), Breast lump (2014), DCIS (ductal carcinoma in situ) of breast (2014), Hypertension, and Kidney stone. Family: family history includes Breast cancer in her mother, sister, and sister; Breast cancer (age of onset: 54) in her cousin; Breast cancer (age of onset: 42) in her maternal aunt; Breast cancer (age of onset: 50) in her maternal aunt; Cancer (age of onset: 17) in her paternal aunt; Diabetes in her paternal grandmother; Prostate cancer (age of onset: 62) in her brother.  Past Surgical History:  Procedure Laterality Date  . BREAST BIOPSY Right 08/12/2015   US guided biopsy w/ clip placement/SPINDLE CELL PROLIFERATION WITH CYTOLOGIC ATYPIA  . BREAST LUMPECTOMY Right 08/29/2015   Procedure: BREAST LUMPECTOMY;  Surgeon: Christene Lye, MD;  Location: [facility] ORS;  Service: General;  Laterality: Right;  . BREAST SURGERY Right 05-16-2013   lumpectomy  . SHOULDER ARTHROSCOPY Right 1996  . [place]   . WISDOM TOOTH EXTRACTION     Active Ambulatory Problems  Diagnosis Date Noted  . DCIS (ductal carcinoma in situ) of breast 05/10/2013  . Breast mass, right 08/12/2015  . UTI (lower urinary tract infection) 02/03/2016  . Chronic bilateral low back pain with right-sided sciatica (Primary Area of Pain) (R>L) 08/03/2018  . Chronic pain of right lower extremity (Secondary Area of Pain) 08/03/2018  . Chronic left sacroiliac joint pain 08/03/2018  . Chronic pain syndrome 08/03/2018  . Pharmacologic therapy 08/03/2018  . Disorder of skeletal system 08/03/2018  . Problems influencing health status  08/03/2018  . Lower extremity weakness (bilateral) 08/03/2018   Resolved Ambulatory Problems    Diagnosis Date Noted  . Lump or mass in breast 04/23/2013   Past Medical History:  Diagnosis Date  . Breast cancer ([place]) 2013  . Breast lump 2014  . Hypertension   . Kidney stone    Constitutional Exam  General appearance: Well nourished, well developed, and well hydrated. In no apparent acute distress Vitals:   08/03/18 1348  BP: (!) 167/116  Pulse: 84  Resp: 18  Temp: 98.4 F (36.9 C)  TempSrc: Oral  SpO2: 97%  Weight: 145 lb (65.8 kg)  Height: 5' (1.524 m)   BMI Assessment: Estimated body mass index is 28.32 kg/m as calculated from the following:   Height as of this encounter: 5' (1.524 m).   Weight as of this encounter: 145 lb (65.8 kg).  BMI interpretation table: BMI level Category Range association with higher incidence of chronic pain  <18 kg/m2 Underweight   18.5-24.9 kg/m2 Ideal body weight   25-29.9 kg/m2 Overweight Increased incidence by 20%  30-34.9 kg/m2 Obese (Class I) Increased incidence by 68%  35-39.9 kg/m2 Severe obesity (Class II) Increased incidence by 136%  >40 kg/m2 Extreme obesity (Class III) Increased incidence by 254%   BMI Readings from Last 4 Encounters:  08/03/18 28.32 kg/m  06/28/18 28.32 kg/m  05/16/18 27.73 kg/m  05/29/17 25.39 kg/m   Wt Readings from Last 4 Encounters:  08/03/18 145 lb (65.8 kg)  06/28/18 145 lb (65.8 kg)  05/16/18 142 lb (64.4 kg)  05/29/17 130 lb (59 kg)  Psych/Mental status: Alert, oriented x 3 (person, place, & time)       Eyes: PERLA Respiratory: No evidence of acute respiratory distress  Cervical Spine Exam  Inspection: No masses, redness, or swelling Alignment: Symmetrical Functional ROM: Unrestricted ROM      Stability: No instability detected Muscle strength & Tone: Functionally intact Sensory: Unimpaired Palpation: No palpable anomalies              Upper Extremity (UE) Exam    Side: Right  upper extremity  Side: Left upper extremity  Inspection: No masses, redness, swelling, or asymmetry. No contractures  Inspection: No masses, redness, swelling, or asymmetry. No contractures  Functional ROM: Unrestricted ROM          Functional ROM: Unrestricted ROM          Muscle strength & Tone: Functionally intact  Muscle strength & Tone: Functionally intact  Sensory: Unimpaired  Sensory: Unimpaired  Palpation: No palpable anomalies              Palpation: No palpable anomalies              Specialized Test(s): Deferred         Specialized Test(s): Deferred          Thoracic Spine Exam  Inspection: No masses, redness, or swelling Alignment: Symmetrical Functional ROM: Unrestricted ROM Stability: No instability detected Sensory:  Unimpaired Muscle strength & Tone: No palpable anomalies  Lumbar Spine Exam  Inspection: No masses, redness, or swelling Alignment: Symmetrical Functional ROM: Unrestricted ROM      Stability: No instability detected Muscle strength & Tone: Functionally intact Sensory: Unimpaired Palpation: Complains of area being tender to palpation       Provocative Tests: Lumbar Hyperextension and rotation test: Positive bilaterally for facet joint pain.leg raises positive on the right Patrick's Maneuver: Positive for left-sided S-I arthralgia              Gait & Posture Assessment  Ambulation: Unassisted Gait: Relatively normal for age and body habitus Posture: WNL   Lower Extremity Exam    Side: Right lower extremity  Side: Left lower extremity  Inspection: No masses, redness, swelling, or asymmetry. No contractures  Inspection: No masses, redness, swelling, or asymmetry. No contractures  Functional ROM: Unrestricted ROM          Functional ROM: Unrestricted ROM          Muscle strength & Tone: Functionally intact  Muscle strength & Tone: Functionally intact  Sensory: Unimpaired  Sensory: Unimpaired  Palpation: No palpable anomalies  Palpation: No palpable  anomalies   Assessment  Primary Diagnosis & Pertinent Problem List: The primary encounter diagnosis was Chronic bilateral low back pain with right-sided sciatica (Primary Area of Pain) (R>L). Diagnoses of Chronic pain of right lower extremity (Secondary Area of Pain), Chronic left sacroiliac joint pain, Weakness of both lower extremities, Chronic pain syndrome, Pharmacologic therapy, Disorder of skeletal system, and Problems influencing health status were also pertinent to this visit.  Visit Diagnosis: 1. Chronic bilateral low back pain with right-sided sciatica (Primary Area of Pain) (R>L)   2. Chronic pain of right lower extremity (Secondary Area of Pain)   3. Chronic left sacroiliac joint pain   4. Weakness of both lower extremities   5. Chronic pain syndrome   6. Pharmacologic therapy   7. Disorder of skeletal system   8. Problems influencing health status    Plan of Care  Initial treatment plan:  Please be advised that as per protocol, today's visit has been an evaluation only. We have not taken over the patient's controlled substance management.  Problem-specific plan: No problem-specific Assessment & Plan notes found for this encounter.  Ordered Lab-work, Procedure(s), Referral(s), & Consult(s): Orders Placed This Encounter  Procedures  . Compliance Drug Analysis, Ur  . Comp. Metabolic Panel (12)  . Magnesium  . Vitamin B12  . Sedimentation rate  . 25-Hydroxyvitamin D Lcms D2+D3  . C-reactive protein  . NCV with EMG(electromyography)   Pharmacotherapy: Medications ordered:  No orders of the defined types were placed in this encounter.  Medications administered during this visit: Tenna Delaine had no medications administered during this visit.   Pharmacotherapy under consideration:  Opioid Analgesics: The patient was informed that there is no guarantee that she would be a candidate for opioid analgesics. The decision will be made following CDC guidelines. This  decision will be based on the results of diagnostic studies, as well as Ms. Swiney's risk profile.  Membrane stabilizer: To be determined at a later time Muscle relaxant: To be determined at a later time NSAID: To be determined at a later time Other analgesic(s): To be determined at a later time   Interventional therapies under consideration: Ms. [place] was informed that there is no guarantee that she would be a candidate for interventional therapies. The decision will be based on the results of  diagnostic studies, as well as Ms. Weltz's risk profile.  Possible procedure(s): Diagnostic right-sided lumbar epidural steroid injection Diagnostic bilateral lumbar facet nerve block Possible bilateral lumbar facet radiofrequency ablation   Provider-requested follow-up: Return for 2nd Visit, w/ Dr. Dossie Arbour.  Future Appointments  Date Time Provider [place]  08/23/2018 10:30 AM Milinda Pointer, MD [facility] None    Primary Care Physician: No primary care provider on file. Location: [facility] Note by:  Date: 08/03/2018; Time: 4:44 PM  Pain Score Disclaimer: We use the NRS-11 scale. This is a self-reported, subjective measurement of pain severity with only modest accuracy. It is used primarily to identify changes within a particular patient. It must be understood that outpatient pain scales are significantly less accurate that those used for research, where they can be applied under ideal controlled circumstances with minimal exposure to variables. In reality, the score is likely to be a combination of pain intensity and pain affect, where pain affect describes the degree of emotional arousal or changes in action readiness caused by the sensory experience of pain. Factors such as social and work situation, setting, emotional state, anxiety levels, expectation, and prior pain experience may influence pain perception and show large inter-individual  differences that may also be affected by time variables.  Patient instructions provided during this appointment: Patient Instructions   ____________________________________________________________________________________________  Appointment Policy Summary  It is our goal and responsibility to provide the medical community with assistance in the evaluation and management of patients with chronic pain. Unfortunately our resources are limited. Because we do not have an unlimited amount of time, or available appointments, we are required to closely monitor and manage their use. The following rules exist to maximize their use:  Patient's responsibilities: 1. Punctuality:  At what time should I arrive? You should be physically present in our office 30 minutes before your scheduled appointment. Your scheduled appointment is with your assigned healthcare provider. However, it takes 5-10 minutes to be "checked-in", and another 15 minutes for the nurses to do the admission. If you arrive to our office at the time you were given for your appointment, you will end up being at least 20-25 minutes late to your appointment with the provider. 2. Tardiness:  What happens if I arrive only a few minutes after my scheduled appointment time? You will need to reschedule your appointment. The cutoff is your appointment time. This is why it is so important that you arrive at least 30 minutes before that appointment. If you have an appointment scheduled for 10:00 AM and you arrive at 10:01, you will be required to reschedule your appointment.  3. Plan ahead:  Always assume that you will encounter traffic on your way in. Plan for it. If you are dependent on a driver, make sure they understand these rules and the need to arrive early. 4. Other appointments and responsibilities:  Avoid scheduling any other appointments before or after your pain clinic appointments.  5. Be prepared:  Write down everything that you need to  discuss with your healthcare provider and give this information to the admitting nurse. Write down the medications that you will need refilled. Bring your pills and bottles (even the empty ones), to all of your appointments, except for those where a procedure is scheduled. 6. No children or pets:  Find someone to take care of them. It is not appropriate to bring them in. 7. Scheduling changes:  We request "advanced notification" of any changes or cancellations. 8. Advanced  notification:  Defined as a time period of more than 24 hours prior to the originally scheduled appointment. This allows for the appointment to be offered to other patients. 9. Rescheduling:  When a visit is rescheduled, it will require the cancellation of the original appointment. For this reason they both fall within the category of "Cancellations".  10. Cancellations:  They require advanced notification. Any cancellation less than 24 hours before the  appointment will be recorded as a "No Show". 11. No Show:  Defined as an unkept appointment where the patient failed to notify or declare to the practice their intention or inability to keep the appointment.  Corrective process for repeat offenders:  1. Tardiness: Three (3) episodes of rescheduling due to late arrivals will be recorded as one (1) "No Show". 2. Cancellation or reschedule: Three (3) cancellations or rescheduling will be recorded as one (1) "No Show". 3. "No Shows": Three (3) "No Shows" within a 12 month period will result in discharge from the practice. ____________________________________________________________________________________________  ____________________________________________________________________________________________  Pain Scale  Introduction: The pain score used by this practice is the Verbal Numerical Rating Scale (VNRS-11). This is an 11-point scale. It is for adults and children 10 years or older. There are significant differences in  how the pain score is reported, used, and applied. Forget everything you learned in the past and learn this scoring system.  General Information: The scale should reflect your current level of pain. Unless you are specifically asked for the level of your worst pain, or your average pain. If you are asked for one of these two, then it should be understood that it is over the past 24 hours.  Basic Activities of Daily Living (ADL): Personal hygiene, dressing, eating, transferring, and using restroom.  Instructions: Most patients tend to report their level of pain as a combination of two factors, their physical pain and their psychosocial pain. This last one is also known as "suffering" and it is reflection of how physical pain affects you socially and psychologically. From now on, report them separately. From this point on, when asked to report your pain level, report only your physical pain. Use the following table for reference.  Pain Clinic Pain Levels (0-5/10)  Pain Level Score  Description  No Pain 0   Mild pain 1 Nagging, annoying, but does not interfere with basic activities of daily living (ADL). Patients are able to eat, bathe, get dressed, toileting (being able to get on and off the toilet and perform personal hygiene functions), transfer (move in and out of bed or a chair without assistance), and maintain continence (able to control bladder and bowel functions). Blood pressure and heart rate are unaffected. A normal heart rate for a healthy adult ranges from 60 to 100 bpm (beats per minute).   Mild to moderate pain 2 Noticeable and distracting. Impossible to hide from other people. More frequent flare-ups. Still possible to adapt and function close to normal. It can be very annoying and may have occasional stronger flare-ups. With discipline, patients may get used to it and adapt.   Moderate pain 3 Interferes significantly with activities of daily living (ADL). It becomes difficult to feed,  bathe, get dressed, get on and off the toilet or to perform personal hygiene functions. Difficult to get in and out of bed or a chair without assistance. Very distracting. With effort, it can be ignored when deeply involved in activities.   Moderately severe pain 4 Impossible to ignore for more than a few  minutes. With effort, patients may still be able to manage work or participate in some social activities. Very difficult to concentrate. Signs of autonomic nervous system discharge are evident: dilated pupils (mydriasis); mild sweating (diaphoresis); sleep interference. Heart rate becomes elevated (>115 bpm). Diastolic blood pressure (lower number) rises above 100 mmHg. Patients find relief in laying down and not moving.   Severe pain 5 Intense and extremely unpleasant. Associated with frowning face and frequent crying. Pain overwhelms the senses.  Ability to do any activity or maintain social relationships becomes significantly limited. Conversation becomes difficult. Pacing back and forth is common, as getting into a comfortable position is nearly impossible. Pain wakes you up from deep sleep. Physical signs will be obvious: pupillary dilation; increased sweating; goosebumps; brisk reflexes; cold, clammy hands and feet; nausea, vomiting or dry heaves; loss of appetite; significant sleep disturbance with inability to fall asleep or to remain asleep. When persistent, significant weight loss is observed due to the complete loss of appetite and sleep deprivation.  Blood pressure and heart rate becomes significantly elevated. Caution: If elevated blood pressure triggers a pounding headache associated with blurred vision, then the patient should immediately seek attention at an urgent or emergency care unit, as these may be signs of an impending stroke.    Emergency Department Pain Levels (6-10/10)  Emergency Room Pain 6 Severely limiting. Requires emergency care and should not be seen or managed at an  outpatient pain management facility. Communication becomes difficult and requires great effort. Assistance to reach the emergency department may be required. Facial flushing and profuse sweating along with potentially dangerous increases in heart rate and blood pressure will be evident.   Distressing pain 7 Self-care is very difficult. Assistance is required to transport, or use restroom. Assistance to reach the emergency department will be required. Tasks requiring coordination, such as bathing and getting dressed become very difficult.   Disabling pain 8 Self-care is no longer possible. At this level, pain is disabling. The individual is unable to do even the most "basic" activities such as walking, eating, bathing, dressing, transferring to a bed, or toileting. Fine motor skills are lost. It is difficult to think clearly.   Incapacitating pain 9 Pain becomes incapacitating. Thought processing is no longer possible. Difficult to remember your own name. Control of movement and coordination are lost.   The worst pain imaginable 10 At this level, most patients pass out from pain. When this level is reached, collapse of the autonomic nervous system occurs, leading to a sudden drop in blood pressure and heart rate. This in turn results in a temporary and dramatic drop in blood flow to the brain, leading to a loss of consciousness. Fainting is one of the body's self defense mechanisms. Passing out puts the brain in a calmed state and causes it to shut down for a while, in order to begin the healing process.    Summary: 1. Refer to this scale when providing [place] with your pain level. 2. Be accurate and careful when reporting your pain level. This will help with your care. 3. Over-reporting your pain level will lead to loss of credibility. 4. Even a level of 1/10 means that there is pain and will be treated at our facility. 5. High, inaccurate reporting will be documented as "Symptom Exaggeration", leading  to loss of credibility and suspicions of possible secondary gains such as obtaining more narcotics, or wanting to appear disabled, for fraudulent reasons. 6. Only pain levels of 5 or below will  be seen at our facility. 7. Pain levels of 6 and above will be sent to the Emergency Department and the appointment cancelled. ____________________________________________________________________________________________

## 2018-08-03 NOTE — Patient Instructions (Signed)

## 2018-08-03 NOTE — Progress Notes (Signed)
Safety precautions to be maintained throughout the outpatient stay will include: orient to surroundings, keep bed in low position, maintain call bell within reach at all times, provide assistance with transfer out of bed and ambulation.  

## 2018-08-07 LAB — 25-HYDROXY VITAMIN D LCMS D2+D3
25-Hydroxy, Vitamin D-2: <1 ng/mL
25-Hydroxy, Vitamin D-3: 20 ng/mL

## 2018-08-07 LAB — COMP. METABOLIC PANEL (12)
A/G RATIO: 1.6 (ref 1.2–2.2)
AST: 47 IU/L — AB (ref 0–40)
Albumin: 4.4 g/dL (ref 3.6–4.8)
Alkaline Phosphatase: 70 IU/L (ref 39–117)
BILIRUBIN TOTAL: 0.7 mg/dL (ref 0.0–1.2)
BUN/Creatinine Ratio: 15 (ref 12–28)
BUN: 12 mg/dL (ref 8–27)
Calcium: 9.6 mg/dL (ref 8.7–10.3)
Chloride: 104 mmol/L (ref 96–106)
Creatinine, Ser: 0.82 mg/dL (ref 0.57–1.00)
GFR calc Af Amer: 89 mL/min/{1.73_m2} (ref >59)
GFR calc non Af Amer: 77 mL/min/{1.73_m2} (ref >59)
Globulin, Total: 2.8 g/dL (ref 1.5–4.5)
Glucose: 116 mg/dL — ABNORMAL HIGH (ref 65–99)
Potassium: 4.4 mmol/L (ref 3.5–5.2)
Sodium: 143 mmol/L (ref 134–144)
Total Protein: 7.2 g/dL (ref 6.0–8.5)

## 2018-08-07 LAB — SEDIMENTATION RATE: SED RATE: 10 mm/h (ref 0–40)

## 2018-08-07 LAB — C-REACTIVE PROTEIN: CRP: 1 mg/L (ref 0–10)

## 2018-08-07 LAB — 25-HYDROXYVITAMIN D LCMS D2+D3: 25-HYDROXY, VITAMIN D: 20 ng/mL — AB

## 2018-08-07 LAB — MAGNESIUM: MAGNESIUM: 2.1 mg/dL (ref 1.6–2.3)

## 2018-08-07 LAB — VITAMIN B12: VITAMIN B 12: 516 pg/mL (ref 232–1245)

## 2018-08-08 ENCOUNTER — Other Ambulatory Visit: Payer: Self-pay

## 2018-08-08 ENCOUNTER — Encounter: Payer: Self-pay | Admitting: Emergency Medicine

## 2018-08-08 ENCOUNTER — Emergency Department: Payer: Medicaid Other

## 2018-08-08 ENCOUNTER — Emergency Department
Admission: EM | Admit: 2018-08-08 | Discharge: 2018-08-08 | Disposition: A | Discharge status: Home / Self Care | Payer: Medicaid Other | Attending: Emergency Medicine | Admitting: Emergency Medicine

## 2018-08-08 DIAGNOSIS — N3001 Acute cystitis with hematuria: Secondary | ICD-10-CM | POA: Diagnosis not present

## 2018-08-08 DIAGNOSIS — Z853 Personal history of malignant neoplasm of breast: Secondary | ICD-10-CM | POA: Insufficient documentation

## 2018-08-08 DIAGNOSIS — I10 Essential (primary) hypertension: Secondary | ICD-10-CM | POA: Diagnosis not present

## 2018-08-08 DIAGNOSIS — F17210 Nicotine dependence, cigarettes, uncomplicated: Secondary | ICD-10-CM | POA: Diagnosis not present

## 2018-08-08 DIAGNOSIS — M545 Low back pain: Secondary | ICD-10-CM | POA: Diagnosis present

## 2018-08-08 DIAGNOSIS — Z79899 Other long term (current) drug therapy: Secondary | ICD-10-CM | POA: Diagnosis not present

## 2018-08-08 LAB — CBC
HCT: 44 % (ref 36.0–46.0)
Hemoglobin: 15.2 g/dL — ABNORMAL HIGH (ref 12.0–15.0)
MCH: 31.9 pg (ref 26.0–34.0)
MCHC: 34.5 g/dL (ref 30.0–36.0)
MCV: 92.4 fL (ref 80.0–100.0)
NRBC: 0 % (ref 0.0–0.2)
PLATELETS: 222 10*3/uL (ref 150–400)
RBC: 4.76 MIL/uL (ref 3.87–5.11)
RDW: 12.7 % (ref 11.5–15.5)
WBC: 7.3 10*3/uL (ref 4.0–10.5)

## 2018-08-08 LAB — URINALYSIS, COMPLETE (UACMP) WITH MICROSCOPIC
BILIRUBIN URINE: NEGATIVE
Glucose, UA: NEGATIVE mg/dL
HGB URINE DIPSTICK: NEGATIVE
Ketones, ur: NEGATIVE mg/dL
NITRITE: POSITIVE — AB
PH: 6 (ref 5.0–8.0)
Protein, ur: NEGATIVE mg/dL
Specific Gravity, Urine: 1.014 (ref 1.005–1.030)

## 2018-08-08 LAB — BASIC METABOLIC PANEL
Anion gap: 8 (ref 5–15)
BUN: 13 mg/dL (ref 8–23)
CALCIUM: 9.8 mg/dL (ref 8.9–10.3)
CO2: 28 mmol/L (ref 22–32)
CREATININE: 0.79 mg/dL (ref 0.44–1.00)
Chloride: 103 mmol/L (ref 98–111)
Glucose, Bld: 123 mg/dL — ABNORMAL HIGH (ref 70–99)
Potassium: 3.4 mmol/L — ABNORMAL LOW (ref 3.5–5.1)
SODIUM: 139 mmol/L (ref 135–145)

## 2018-08-08 LAB — TROPONIN I: Troponin I: <0.03 ng/mL (ref <0.03)

## 2018-08-08 MED ORDER — CEPHALEXIN 500 MG PO CAPS
500.0000 mg | ORAL_CAPSULE | Freq: Once | ORAL | Status: AC
  Administered 2018-08-08: 500 mg via ORAL
  Filled 2018-08-08: qty 1

## 2018-08-08 MED ORDER — CEPHALEXIN 500 MG PO CAPS: 500.0000 mg | ORAL_CAPSULE | Freq: Three times a day (TID) | ORAL | 0 refills | Status: AC

## 2018-08-08 MED ORDER — ONDANSETRON 4 MG PO TBDP
4.0000 mg | ORAL_TABLET | Freq: Once | ORAL | Status: AC
  Administered 2018-08-08: 4 mg via ORAL
  Filled 2018-08-08: qty 1

## 2018-08-08 MED ORDER — OXYCODONE-ACETAMINOPHEN 5-325 MG PO TABS
1.0000 | ORAL_TABLET | Freq: Once | ORAL | Status: AC
  Administered 2018-08-08: 1 via ORAL
  Filled 2018-08-08: qty 1

## 2018-08-08 NOTE — ED Triage Notes (Signed)
Pt to ED from home c/o back pain, left and right rib pain, and dizziness for 4 days.  States urinary frequency, burning with urination a couple days ago now resolved.  States goes to pain clinic but can't get in until November.  Chest rise even and unlabored, skin warm and dry, NAD at this time.

## 2018-08-08 NOTE — ED Notes (Signed)
Patient transported to CT 

## 2018-08-08 NOTE — ED Provider Notes (Signed)
[facility] Emergency Department Provider Note  ____________________________________________  Time seen: Approximately 4:34 PM  I have reviewed the triage vital signs and the nursing notes.   HISTORY  Chief Complaint Dizziness and Back Pain   HPI Tricia Wilson is a 62 y.o. female with a history of remote breast cancer, hypertension, kidney stones who presents for evaluation of bilateral flank pain.  Patient reports several days of urinary frequency and dysuria however those symptoms resolved over the last 24 hours.  She has had 4 days of bilateral dull and intermittent sharp flank pain.  She has had nausea but no vomiting, no fever but has had chills.  No diarrhea, no constipation, no chest pain or shortness of breath.  Past Medical History:  Diagnosis Date  . Breast cancer [facility]) 2013   Right breast cancer - Radiation  . Breast lump 2014  . 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 Hypertension   . Kidney stone     Patient Active Problem List   Diagnosis Date Noted  . Chronic bilateral low back pain with right-sided sciatica (Primary Area of Pain) (R>L) 08/03/2018  . Chronic pain of right lower extremity (Secondary Area of Pain) 08/03/2018  . Chronic left sacroiliac joint pain 08/03/2018  . Chronic pain syndrome 08/03/2018  . Pharmacologic therapy 08/03/2018  . Disorder of skeletal system 08/03/2018  . Problems influencing health status 08/03/2018  . Lower extremity weakness (bilateral) 08/03/2018  . UTI (lower urinary tract infection) 02/03/2016  . Breast mass, right 08/12/2015  . DCIS (ductal carcinoma in situ) of breast 05/10/2013    Past Surgical History:  Procedure Laterality Date  . BREAST BIOPSY Right 08/12/2015   US guided biopsy w/ clip placement/SPINDLE CELL PROLIFERATION WITH CYTOLOGIC ATYPIA  . BREAST LUMPECTOMY Right 08/29/2015   Procedure: BREAST LUMPECTOMY;  Surgeon: Christene Lye, MD;  Location: [facility] ORS;  Service: General;  Laterality: Right;  . BREAST SURGERY Right 05-16-2013   lumpectomy  . SHOULDER ARTHROSCOPY Right 1996  . [place]   . WISDOM TOOTH EXTRACTION      Prior to Admission medications   Medication Sig Start Date End Date Taking? Authorizing Provider  amLODipine (NORVASC) 5 MG tablet Take 1 tablet (5 mg total) by mouth daily. In am 05/16/18 08/03/18  Hinda Kehr, MD  cephALEXin (KEFLEX) 500 MG capsule Take 1 capsule (500 mg total) by mouth 3 (three) times daily for 7 days. 08/08/18 08/15/18  Rudene Re, MD  gabapentin (NEURONTIN) 300 MG capsule Take 1 capsule (300 mg total) by mouth 3 (three) times daily. 06/29/18 06/29/19  Darel Hong, MD  omeprazole (PRILOSEC) 20 MG capsule Take 20 mg by mouth daily. As needed.    [provider]    Allergies Hydrocodone; Morphine and related; and Motrin [ibuprofen]  Family History  Problem Relation Age of Onset  . Breast cancer Mother   . Cancer Paternal Aunt 76       small bowel  . Breast cancer Maternal Aunt 20  . Breast cancer Sister        younger than age 53  . Breast cancer Sister        younger than age 75  . Breast cancer Maternal Aunt 93  . Breast cancer Cousin 65  . Prostate cancer Brother 40  . Diabetes Paternal Grandmother   . Ovarian cancer Neg Hx  Social History Social History   Tobacco Use  . Smoking status: Current Every Day Smoker    Packs/day: 0.25    Years: 20.00    Pack years: 5.00    Types: Cigarettes  . Smokeless tobacco: Never Used  Substance Use Topics  . Alcohol use: Yes    Alcohol/week: 0.0 - 1.0 standard drinks    Comment: "occassional alcohol use"  . Drug use: No    Review of Systems  Constitutional: Negative for fever. Eyes: Negative for visual changes. ENT: Negative for sore throat. Neck: No neck pain  Cardiovascular: Negative for chest pain. Respiratory: Negative for shortness of  breath. Gastrointestinal: Negative for abdominal pain, vomiting or diarrhea. Genitourinary: + dysuria, frequency and bilateral flank pain Musculoskeletal: Negative for back pain. Skin: Negative for rash. Neurological: Negative for headaches, weakness or numbness. Psych: No SI or HI  ____________________________________________   PHYSICAL EXAM:  VITAL SIGNS: ED Triage Vitals  Enc Vitals Group     BP 08/08/18 1325 (!) 165/108     Pulse Rate 08/08/18 1325 72     Resp 08/08/18 1325 18     Temp 08/08/18 1325 97.9 F (36.6 C)     Temp Source 08/08/18 1325 Oral     SpO2 08/08/18 1325 96 %     Weight 08/08/18 1325 145 lb (65.8 kg)     Height 08/08/18 1325 5' (1.524 m)     Head Circumference --      Peak Flow --      Pain Score 08/08/18 1329 10     Pain Loc --      Pain Edu? --      Excl. in [place]? --     Constitutional: Alert and oriented. Well appearing and in no apparent distress. HEENT:      Head: Normocephalic and atraumatic.         Eyes: Conjunctivae are normal. Sclera is non-icteric.       Mouth/Throat: Mucous membranes are moist.       Neck: Supple with no signs of meningismus. Cardiovascular: Regular rate and rhythm. No murmurs, gallops, or rubs. 2+ symmetrical distal pulses are present in all extremities. No JVD. Respiratory: Normal respiratory effort. Lungs are clear to auscultation bilaterally. No wheezes, crackles, or rhonchi.  Gastrointestinal: Soft, suprapubic tenderness, and non distended with positive bowel sounds. No rebound or guarding. Genitourinary: No CVA tenderness. Musculoskeletal: Nontender with normal range of motion in all extremities. No edema, cyanosis, or erythema of extremities. Neurologic: Normal speech and language. Face is symmetric. Moving all extremities. No gross focal neurologic deficits are appreciated. Skin: Skin is warm, dry and intact. No rash noted. Psychiatric: Mood and affect are normal. Speech and behavior are  normal.  ____________________________________________   LABS (all labs ordered are listed, but only abnormal results are displayed)  Labs Reviewed  BASIC METABOLIC PANEL - Abnormal; Notable for the following components:      Result Value   Potassium 3.4 (*)    Glucose, Bld 123 (*)    All other components within normal limits  CBC - Abnormal; Notable for the following components:   Hemoglobin 15.2 (*)    All other components within normal limits  URINALYSIS, COMPLETE (UACMP) WITH MICROSCOPIC - Abnormal; Notable for the following components:   Color, Urine YELLOW (*)    APPearance HAZY (*)    Nitrite POSITIVE (*)    Leukocytes, UA LARGE (*)    Bacteria, UA FEW (*)    All other components within normal  limits  URINE CULTURE  TROPONIN I   ____________________________________________  EKG  ED ECG REPORT I, Rudene Re, the attending physician, personally viewed and interpreted this ECG.  Normal sinus rhythm, rate of 72, normal intervals, normal axis, no ST elevations or depressions, diffuse T wave flattening.  No significant changes when compared to prior. ____________________________________________  RADIOLOGY  I have personally reviewed the images performed during this visit and I agree with the Radiologist's read.   Interpretation by Radiologist:  Ct Renal Stone Study  Result Date: 08/08/2018 CLINICAL DATA:  Bilateral rib pain and dizziness for 4 days. Urinary frequency and burning with urination 2-3 days ago which has since resolved. EXAM: CT ABDOMEN AND PELVIS WITHOUT CONTRAST TECHNIQUE: Multidetector CT imaging of the abdomen and pelvis was performed following the standard protocol without IV contrast. COMPARISON:  CT abdomen and pelvis 05/05/2016. FINDINGS: Lower chest: Mild dependent atelectasis is noted. Heart size is upper normal. No pleural or pericardial effusion. Hepatobiliary: Approximately 4 hepatic cysts are unchanged. The liver is low attenuating  consistent with fatty infiltration. High attenuation material layering dependently within the gallbladder is compatible with sludge. No evidence of cholecystitis. Biliary tree is unremarkable. Pancreas: Unremarkable. No pancreatic ductal dilatation or surrounding inflammatory changes. Spleen: Normal in size without focal abnormality. Adrenals/Urinary Tract: The adrenal glands appear normal. The patient has multiple nonobstructing right renal stones. The largest stone measures 0.4 cm in diameter. The remaining stones are punctate in size. There is no hydronephrosis or ureteral stone. The left kidney appears normal. The adrenal glands appear normal. Stomach/Bowel: Scattered colonic diverticula are seen. No diverticulitis. The colon is otherwise normal in appearance. The stomach, small bowel and appendix appear normal. Vascular/Lymphatic: Aortic atherosclerosis. No enlarged abdominal or pelvic lymph nodes. Reproductive: Uterus and bilateral adnexa are unremarkable. Other: None. Musculoskeletal: No acute or focal lesion. Degenerative disc disease L5-S1 noted. IMPRESSION: No acute abnormality abdomen or pelvis. Multiple nonobstructing right renal stones. Fatty infiltration of the liver. Likely sludge within the gallbladder without evidence of cholecystitis. Atherosclerosis. Diverticulosis without diverticulitis. Electronically Signed   By: Inge Rise M.D.   On: 08/08/2018 16:00     ____________________________________________   PROCEDURES  Procedure(s) performed: None Procedures Critical Care performed:  None ____________________________________________   INITIAL IMPRESSION / ASSESSMENT AND PLAN / ED COURSE   62 y.o. female with a history of remote breast cancer, hypertension, kidney stones who presents for evaluation of bilateral flank pain, dysuria, and frequency.  Patient is well-appearing and in no distress, she has normal vital signs, abdomen showing mild suprapubic tenderness with no flank  tenderness.  UA is positive for UTI.  No signs of sepsis with no fever, no leukocytosis, no tachycardia.  CT renal was done to rule out superimposed uteral stone which is negative. Patient started on keflex.  Will DC home with follow-up with primary care doctor.  Discussed return precautions for any signs of sepsis or pyelonephritis.      As part of my medical decision making, I reviewed the following data within the [place] notes reviewed and incorporated, Labs reviewed , Old chart reviewed, Radiograph reviewed , Notes from prior ED visits and [place] Controlled Substance Database    Pertinent labs & imaging results that were available during my care of the patient were reviewed by me and considered in my medical decision making (see chart for details).    ____________________________________________   FINAL CLINICAL IMPRESSION(S) / ED DIAGNOSES  Final diagnoses:  Acute cystitis with hematuria  NEW MEDICATIONS STARTED DURING THIS VISIT:  ED Discharge Orders         Ordered    cephALEXin (KEFLEX) 500 MG capsule  3 times daily     08/08/18 1639           Note:  This document was prepared using Dragon voice recognition software and may include unintentional dictation errors.    Rudene Re, MD 08/08/18 [id number]

## 2018-08-08 NOTE — Discharge Instructions (Addendum)

## 2018-08-08 NOTE — ED Notes (Signed)
POC Preg- Negative ?

## 2018-08-09 ENCOUNTER — Encounter: Payer: Self-pay | Admitting: Nurse Practitioner

## 2018-08-09 DIAGNOSIS — F1490 Cocaine use, unspecified, uncomplicated: Secondary | ICD-10-CM | POA: Insufficient documentation

## 2018-08-09 LAB — COMPLIANCE DRUG ANALYSIS, UR

## 2018-08-11 LAB — URINE CULTURE: Culture: >=100000 — AB

## 2018-08-22 NOTE — Progress Notes (Deleted)
Patient's Name: Tricia Wilson  MRN: [id number]  Referring Provider: No ref. provider found  DOB: 20-Nov-1955  PCP: Patient, No Pcp Per  DOS: 08/23/2018  Note by: Gaspar Cola, MD  Service setting: Ambulatory outpatient  Specialty: Interventional Pain Management  Location: [facility] (AMB) Pain Management Facility    Patient type: Established   Primary Reason(s) for Visit: Encounter for evaluation before starting new chronic pain management plan of care (Level of risk: moderate) CC: No chief complaint on file.  HPI  Tricia Wilson is a 62 y.o. year old, female patient, who comes today for a follow-up evaluation to review the test results and decide on a treatment plan. 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 Her primarily concern today is the No chief complaint on file.  Pain Assessment: Location:     Radiating:   Onset:   Duration:   Quality:   Severity:  /10 (subjective, self-reported pain score)  Note: Reported level is compatible with observation.                         When using our objective Pain Scale, levels between 6 and 10/10 are said to belong in an emergency room, as it progressively worsens from a 6/10, described as severely limiting, requiring emergency care not usually available at an outpatient pain management facility. At a 6/10 level, communication becomes difficult and requires great effort. Assistance to reach the emergency department may be required. Facial flushing and profuse sweating along with potentially dangerous increases in heart rate and  blood pressure will be evident. Effect on ADL:   Timing:   Modifying factors:   BP:    HR:    Ms. Djordjevic comes in today for a follow-up visit after her initial evaluation on 08/03/2018. Today we went over the results of her tests. These were explained in "Layman's terms". During today's appointment we went over my diagnostic impression, as well as the proposed treatment plan.  ***  In considering the treatment plan options, Tricia Wilson was reminded that I no longer take patients for medication management only. I asked her to let me know if she had no intention of taking advantage of the interventional therapies, so that we could make arrangements to provide this space to someone interested. I also made it clear that undergoing interventional therapies for the purpose of getting pain medications is very inappropriate on the part of a patient, and it will not be tolerated in this practice. This type of behavior would suggest true addiction and therefore it requires referral to an addiction specialist.   Further details on both, my assessment(s), as well as the proposed treatment plan, please see below.  Controlled Substance Pharmacotherapy Assessment REMS (Risk Evaluation and Mitigation Strategy)  Analgesic: ***  MME/day: *** mg/day. Pill Count: None expected due to no prior prescriptions written by our practice. No notes on file Pharmacokinetics: Liberation and absorption (onset of action): WNL Distribution (time to peak effect): WNL Metabolism and excretion (duration of action): WNL         Pharmacodynamics: Desired effects: Analgesia: Tricia Wilson reports >50% benefit. Functional  ability: Patient reports that medication allows her to accomplish basic ADLs Clinically meaningful improvement in function (CMIF): Sustained CMIF goals met Perceived effectiveness: Described as relatively effective, allowing for increase in activities of daily living (ADL) Undesirable effects: Side-effects  or Adverse reactions: None reported Monitoring: [place] PMP: Online review of the past 26-monthperiod previously conducted. Not applicable at this point since we have not taken over the patient's medication management yet. List of other Serum/Urine Drug Screening Test(s):  No results found for: AMPHSCRSER, BARBSCRSER, BENZOSCRSER, COCAINSCRSER, COCAINSCRNUR, PCPSCRSER, THCSCRSER, THCU, CANNABQUANT, [place] UDS test(s) done:  Lab Results  Component Value Date   SUMMARY FINAL 08/03/2018   Last UDS on record: Summary  Date Value Ref Range Status  08/03/2018 FINAL  Final    Comment:    ==================================================================== TOXASSURE COMP DRUG ANALYSIS,UR ==================================================================== Test                             Result       Flag       Units Drug Present not Declared for Prescription Verification   Benzoylecgonine                227          UNEXPECTED ng/mg creat    Benzoylecgonine is a metabolite of cocaine; its presence    indicates use of this drug.  Source is most commonly illicit, but    cocaine is present in some topical anesthetic solutions. Drug Absent but Declared for Prescription Verification   Gabapentin                     Not Detected UNEXPECTED ==================================================================== Test                      Result    Flag   Units      Ref Range   Creatinine              191              mg/dL      >=20 ==================================================================== Declared Medications:  The flagging and interpretation on this report are based on the  following declared medications.  Unexpected results may arise from  inaccuracies in the declared medications.  **Note: The testing scope of this panel includes these medications:  Gabapentin (Neurontin)  **Note: The testing scope of this panel does not include following   reported medications:  Amlodipine (Norvasc)  Omeprazole (Prilosec) ==================================================================== For clinical consultation, please call ([phone] ====================================================================    UDS interpretation: No unexpected findings.          Medication Assessment Form: Patient introduced to form today Treatment compliance: Treatment may start today if patient agrees with proposed plan. Evaluation of compliance is not applicable at this point Risk Assessment Profile: Aberrant behavior: See initial evaluations. None observed or detected today Comorbid factors increasing risk of overdose: See initial evaluation. No additional risks detected today Opioid risk tool (ORT) (Total Score):   Personal History of Substance Abuse (SUD-Substance use disorder):  Alcohol:    Illegal Drugs:    Rx Drugs:    ORT Risk Level calculation:   Risk of substance use disorder (SUD): Low  ORT Scoring interpretation table:  Score <3 = Low Risk for SUD  Score between 4-7 = Moderate Risk for SUD  Score >8 = High Risk for Opioid Abuse  Risk Mitigation Strategies:  Patient opioid safety counseling: Completed today. Counseling provided to patient as per "Patient Counseling Document". Document signed by patient, attesting to counseling and understanding Patient-Prescriber Agreement (PPA): Obtained today.  Controlled substance notification to other providers: Written and sent today.  Pharmacologic Plan: Today we may be taking over the patient's pharmacological regimen. See below.             Laboratory Chemistry  Inflammation Markers (CRP: Acute Phase) (ESR: Chronic Phase) Lab Results  Component Value Date   CRP 1 08/03/2018   ESRSEDRATE 10 08/03/2018                         Rheumatology Markers No results found for: RF, [place], LABURIC, URICUR, LYMEIGGIGMAB, LYMEABIGMQN, HLAB27                      Renal Function Markers Lab Results   Component Value Date   BUN 13 08/08/2018   CREATININE 0.79 08/08/2018   BCR 15 08/03/2018   GFRAA >60 08/08/2018   GFRNONAA >60 08/08/2018                             Hepatic Function Markers Lab Results  Component Value Date   AST 47 (H) 08/03/2018   ALT 54 (H) 05/16/2018   ALBUMIN 4.4 08/03/2018   ALKPHOS 70 08/03/2018                        Electrolytes Lab Results  Component Value Date   NA 139 08/08/2018   K 3.4 (L) 08/08/2018   CL 103 08/08/2018   CALCIUM 9.8 08/08/2018   MG 2.1 08/03/2018                        Neuropathy Markers Lab Results  Component Value Date   HRCBULAG53 646 08/03/2018                        CNS Tests No results found for: COLORCSF, APPEARCSF, RBCCOUNTCSF, WBCCSF, POLYSCSF, LYMPHSCSF, EOSCSF, PROTEINCSF, GLUCCSF, JCVIRUS, CSFOLI, IGGCSF                      Bone Pathology Markers Lab Results  Component Value Date   25OHVITD1 20 (L) 08/03/2018   25OHVITD2 <1.0 08/03/2018   25OHVITD3 20 08/03/2018                         Coagulation Parameters Lab Results  Component Value Date   PLT 222 08/08/2018                        Cardiovascular Markers Lab Results  Component Value Date   TROPONINI <0.03 08/08/2018   HGB 15.2 (H) 08/08/2018   HCT 44.0 08/08/2018                         CA Markers No results found for: CEA, CA125, LABCA2                      Note: Lab results reviewed.  Recent Diagnostic Imaging Review  Cervical Imaging: Cervical MR wo contrast: No results found for this or any previous visit. Cervical MR wo contrast: No procedure found. Cervical  MR w/wo contrast: No results found for this or any previous visit. Cervical MR w contrast: No results found for this or any previous visit. Cervical CT wo contrast: No results found for this or any previous visit. Cervical CT w/wo contrast: No results found for this or any previous visit. Cervical CT w/wo contrast: No results found for this or any previous  visit. Cervical CT w contrast: No results found for this or any previous visit. Cervical CT outside: No results found for this or any previous visit. Cervical DG 1 view: No results found for this or any previous visit. Cervical DG 2-3 views: No results found for this or any previous visit. Cervical DG F/E views: No results found for this or any previous visit. Cervical DG 2-3 clearing views: No results found for this or any previous visit. Cervical DG Bending/F/E views: No results found for this or any previous visit. Cervical DG complete: No results found for this or any previous visit. Cervical DG Myelogram views: No results found for this or any previous visit. Cervical DG Myelogram views: No results found for this or any previous visit. Cervical Discogram views: No results found for this or any previous visit.  Shoulder Imaging: Shoulder-R MR w contrast: No results found for this or any previous visit. Shoulder-L MR w contrast: No results found for this or any previous visit. Shoulder-R MR w/wo contrast: No results found for this or any previous visit. Shoulder-L MR w/wo contrast: No results found for this or any previous visit. Shoulder-R MR wo contrast: No results found for this or any previous visit. Shoulder-L MR wo contrast: No results found for this or any previous visit. Shoulder-R CT w contrast: No results found for this or any previous visit. Shoulder-L CT w contrast: No results found for this or any previous visit. Shoulder-R CT w/wo contrast: No results found for this or any previous visit. Shoulder-L CT w/wo contrast: No results found for this or any previous visit. Shoulder-R CT wo contrast: No results found for this or any previous visit. Shoulder-L CT wo contrast: No results found for this or any previous visit. Shoulder-R DG Arthrogram: No results found for this or any previous visit. Shoulder-L DG Arthrogram: No results found for this or any previous visit. Shoulder-R  DG 1 view: No results found for this or any previous visit. Shoulder-L DG 1 view: No results found for this or any previous visit. Shoulder-R DG: No results found for this or any previous visit. Shoulder-L DG: No results found for this or any previous visit.  Thoracic Imaging: Thoracic MR wo contrast: No results found for this or any previous visit. Thoracic MR wo contrast: No procedure found. Thoracic MR w/wo contrast: No results found for this or any previous visit. Thoracic MR w contrast: No results found for this or any previous visit. Thoracic CT wo contrast: No results found for this or any previous visit. Thoracic CT w/wo contrast: No results found for this or any previous visit. Thoracic CT w/wo contrast: No results found for this or any previous visit. Thoracic CT w contrast: No results found for this or any previous visit. Thoracic DG 2-3 views:  Results for orders placed during the hospital encounter of 05/16/18  DG Thoracic Spine 2 View   Narrative CLINICAL DATA:  Fall down stairs several days ago. Thoracic, lumbar, and sacral pain.  EXAM: THORACIC SPINE 2 VIEWS  COMPARISON:  None.  FINDINGS: The alignment is maintained. Vertebral body heights are maintained. No evidence  of fracture. Mild endplate spurring of the mid lower thoracic spine. Posterior elements appear intact. There is no paravertebral soft tissue abnormality.  IMPRESSION: Negative for fracture of the thoracic spine.   Electronically Signed   By: Jeb Levering M.D.   On: 05/16/2018 04:01    Thoracic DG 4 views: No results found for this or any previous visit. Thoracic DG: No results found for this or any previous visit. Thoracic DG w/swimmers view: No results found for this or any previous visit. Thoracic DG Myelogram views: No results found for this or any previous visit. Thoracic DG Myelogram views: No results found for this or any previous visit.  Lumbosacral Imaging: Lumbar MR wo contrast:  No results found for this or any previous visit. Lumbar MR wo contrast: No procedure found. Lumbar MR w/wo contrast: No results found for this or any previous visit. Lumbar MR w contrast: No results found for this or any previous visit. Lumbar CT wo contrast: No results found for this or any previous visit. Lumbar CT w/wo contrast: No results found for this or any previous visit. Lumbar CT w/wo contrast: No results found for this or any previous visit. Lumbar CT w contrast: No results found for this or any previous visit. Lumbar DG 1V: No results found for this or any previous visit. Lumbar DG 1V (Clearing): No results found for this or any previous visit. Lumbar DG 2-3V (Clearing): No results found for this or any previous visit. Lumbar DG 2-3 views:  Results for orders placed during the hospital encounter of 05/16/18  DG Lumbar Spine 2-3 Views   Narrative CLINICAL DATA:  Fall down stairs several days ago. Thoracic, lumbar, and sacral pain.  EXAM: LUMBAR SPINE - 2-3 VIEW  COMPARISON:  Abdominal CT 05/05/2016  FINDINGS: The alignment is maintained. Vertebral body heights are normal. There is no listhesis. The posterior elements are intact. Disc space narrowing at L2-L3 and L5-S1 with associated endplate spurring. No fracture. Sacroiliac joints are symmetric and normal. Right nephrolithiasis.  IMPRESSION: 1. Negative for acute fracture of the lumbar spine. 2. 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   Electronically Signed   By: Jeb Levering M.D.   On: 05/16/2018 04:03    Lumbar DG (Complete) 4+V: No results found for this or any previous visit. Lumbar DG F/E views: No results found for this or any previous visit. Lumbar DG Bending views: No results found for this or any previous visit. Lumbar DG Myelogram views: No results found for this or any previous visit. Lumbar DG Myelogram: No results found for this or any previous visit. Lumbar DG Myelogram: No results  found for this or any previous visit. Lumbar DG Myelogram: No results found for this or any previous visit. Lumbar DG Myelogram Lumbosacral: No results found for this or any previous visit. Lumbar DG Diskogram views: No results found for this or any previous visit. Lumbar DG Diskogram views: No results found for this or any previous visit. Lumbar DG Epidurogram OP: No results found for this or any previous visit. Lumbar DG Epidurogram IP: No results found for this or any previous visit.  Sacroiliac Joint Imaging: Sacroiliac Joint DG: No results found for this or any previous visit. Sacroiliac Joint MR w/wo contrast: No results found for this or any previous visit. Sacroiliac Joint MR wo contrast: No results found for this or any previous visit.  Spine Imaging: Whole Spine DG Myelogram views: No results found for this or any previous visit. Whole Spine  MR Mets screen: No results found for this or any previous visit. Whole Spine MR Mets screen: No results found for this or any previous visit. Whole Spine MR w/wo: No results found for this or any previous visit. MRA Spinal Canal w/ cm: No results found for this or any previous visit. MRA Spinal Canal wo/ cm: No procedure found. MRA Spinal Canal w/wo cm: No results found for this or any previous visit. Spine Outside MR Films: No results found for this or any previous visit. Spine Outside CT Films: No results found for this or any previous visit. CT-Guided Biopsy: No results found for this or any previous visit. CT-Guided Needle Placement: No results found for this or any previous visit. DG Spine outside: No results found for this or any previous visit. IR Spine outside: No results found for this or any previous visit. NM Spine outside: No results found for this or any previous visit. Epidurography 1: No results found for this or any previous visit. Epidurography 2: No results found for this or any previous visit.  Hip Imaging: Hip-R MR w  contrast: No results found for this or any previous visit. Hip-L MR w contrast: No results found for this or any previous visit. Hip-R MR w/wo contrast: No results found for this or any previous visit. Hip-L MR w/wo contrast: No results found for this or any previous visit. Hip-R MR wo contrast: No results found for this or any previous visit. Hip-L MR wo contrast: No results found for this or any previous visit. Hip-R CT w contrast: No results found for this or any previous visit. Hip-L CT w contrast: No results found for this or any previous visit. Hip-R CT w/wo contrast: No results found for this or any previous visit. Hip-L CT w/wo contrast: No results found for this or any previous visit. Hip-R CT wo contrast: No results found for this or any previous visit. Hip-L CT wo contrast: No results found for this or any previous visit. Hip-R DG 2-3 views: No results found for this or any previous visit. Hip-L DG 2-3 views: No results found for this or any previous visit. Hip-R DG Arthrogram: No results found for this or any previous visit. Hip-L DG Arthrogram: No results found for this or any previous visit. Hip-B DG Bilateral: No results found for this or any previous visit.  Knee Imaging: Knee-R MR w contrast: No results found for this or any previous visit. Knee-L MR w contrast: No results found for this or any previous visit. Knee-R MR w/wo contrast: No results found for this or any previous visit. Knee-L MR w/wo contrast: No results found for this or any previous visit. Knee-R MR wo contrast: No results found for this or any previous visit. Knee-L MR wo contrast: No results found for this or any previous visit. Knee-R CT w contrast: No results found for this or any previous visit. Knee-L CT w contrast: No results found for this or any previous visit. Knee-R CT w/wo contrast: No results found for this or any previous visit. Knee-L CT w/wo contrast: No results found for this or any  previous visit. Knee-R CT wo contrast: No results found for this or any previous visit. Knee-L CT wo contrast: No results found for this or any previous visit. Knee-R DG 1-2 views: No results found for this or any previous visit. Knee-L DG 1-2 views: No results found for this or any previous visit. Knee-R DG 3 views: No results found for this or  any previous visit. Knee-L DG 3 views: No results found for this or any previous visit. Knee-R DG 4 views: No results found for this or any previous visit. Knee-L DG 4 views: No results found for this or any previous visit. Knee-R DG Arthrogram: No results found for this or any previous visit. Knee-L DG Arthrogram: No results found for this or any previous visit.  Ankle Imaging: Ankle-R DG Complete: No results found for this or any previous visit. Ankle-L DG Complete: No results found for this or any previous visit.  Foot Imaging: Foot-R DG Complete: No results found for this or any previous visit. Foot-L DG Complete: No results found for this or any previous visit.  Elbow Imaging: Elbow-R DG Complete: No results found for this or any previous visit. Elbow-L DG Complete: No results found for this or any previous visit.  Wrist Imaging: Wrist-R DG Complete: No results found for this or any previous visit. Wrist-L DG Complete: No results found for this or any previous visit.  Hand Imaging: Hand-R DG Complete: No results found for this or any previous visit. Hand-L DG Complete: No results found for this or any previous visit.  Complexity Note: Imaging results reviewed. Results shared with Ms. Nikolic, using [employer].                         Meds   Current Outpatient Medications:  .  amLODipine (NORVASC) 5 MG tablet, Take 1 tablet (5 mg total) by mouth daily. In am, Disp: 30 tablet, Rfl: 1 .  gabapentin (NEURONTIN) 300 MG capsule, Take 1 capsule (300 mg total) by mouth 3 (three) times daily., Disp: 90 capsule, Rfl: 0 .  omeprazole  (PRILOSEC) 20 MG capsule, Take 20 mg by mouth daily. As needed., Disp: , Rfl:   ROS  Constitutional: Denies any fever or chills Gastrointestinal: No reported hemesis, hematochezia, vomiting, or acute GI distress Musculoskeletal: Denies any acute onset joint swelling, redness, loss of ROM, or weakness Neurological: No reported episodes of acute onset apraxia, aphasia, dysarthria, agnosia, amnesia, paralysis, loss of coordination, or loss of consciousness  Allergies  Ms. Feely is allergic to hydrocodone; morphine and related; and motrin [ibuprofen].  [place]  Drug: Ms. Sheeran  reports that she does not use drugs. Alcohol:  reports that she drinks alcohol. Tobacco:  reports that she has been smoking cigarettes. She has a 5.00 pack-year smoking history. She has never used smokeless tobacco. Medical:  has a past medical history of Breast cancer ([place]) (2013), Breast lump (2014), DCIS (ductal carcinoma in situ) of breast (2014), Hypertension, and Kidney stone. Surgical: Ms. Abbasi  has a past surgical history that includes [place] tooth extraction; Shoulder arthroscopy (Right, 1996); Breast surgery (Right, 05-16-2013); Breast biopsy (Right, 08/12/2015); Breast lumpectomy (Right, 08/29/2015); and Tubal ligation (1981). Family: family history includes Breast cancer in her mother, sister, and sister; Breast cancer (age of onset: 62) in her cousin; Breast cancer (age of onset: 62) in her maternal aunt; Breast cancer (age of onset: 48) in her maternal aunt; Cancer (age of onset: 28) in her paternal aunt; Diabetes in her paternal grandmother; Prostate cancer (age of onset: 74) in her brother.  Constitutional Exam  General appearance: Well nourished, well developed, and well hydrated. In no apparent acute distress There were no vitals filed for this visit. BMI Assessment: Estimated body mass index is 28.32 kg/m as calculated from the following:   Height as of 08/08/18: 5' (1.524 m).   Weight  as of  08/08/18: 145 lb (65.8 kg).  BMI interpretation table: BMI level Category Range association with higher incidence of chronic pain  <18 kg/m2 Underweight   18.5-24.9 kg/m2 Ideal body weight   25-29.9 kg/m2 Overweight Increased incidence by 20%  30-34.9 kg/m2 Obese (Class I) Increased incidence by 68%  35-39.9 kg/m2 Severe obesity (Class II) Increased incidence by 136%  >40 kg/m2 Extreme obesity (Class III) Increased incidence by 254%   Patient's current BMI Ideal Body weight  There is no height or weight on file to calculate BMI. Patient weight not recorded   BMI Readings from Last 4 Encounters:  08/08/18 28.32 kg/m  08/03/18 28.32 kg/m  06/28/18 28.32 kg/m  05/16/18 27.73 kg/m   Wt Readings from Last 4 Encounters:  08/08/18 145 lb (65.8 kg)  08/03/18 145 lb (65.8 kg)  06/28/18 145 lb (65.8 kg)  05/16/18 142 lb (64.4 kg)  Psych/Mental status: Alert, oriented x 3 (person, place, & time)       Eyes: PERLA Respiratory: No evidence of acute respiratory distress  Cervical Spine Area Exam  Skin & Axial Inspection: No masses, redness, edema, swelling, or associated skin lesions Alignment: Symmetrical Functional ROM: Unrestricted ROM      Stability: No instability detected Muscle Tone/Strength: Functionally intact. No obvious neuro-muscular anomalies detected. Sensory (Neurological): Unimpaired Palpation: No palpable anomalies              Upper Extremity (UE) Exam    Side: Right upper extremity  Side: Left upper extremity  Skin & Extremity Inspection: Skin color, temperature, and hair growth are WNL. No peripheral edema or cyanosis. No masses, redness, swelling, asymmetry, or associated skin lesions. No contractures.  Skin & Extremity Inspection: Skin color, temperature, and hair growth are WNL. No peripheral edema or cyanosis. No masses, redness, swelling, asymmetry, or associated skin lesions. No contractures.  Functional ROM: Unrestricted ROM          Functional ROM:  Unrestricted ROM          Muscle Tone/Strength: Functionally intact. No obvious neuro-muscular anomalies detected.  Muscle Tone/Strength: Functionally intact. No obvious neuro-muscular anomalies detected.  Sensory (Neurological): Unimpaired          Sensory (Neurological): Unimpaired          Palpation: No palpable anomalies              Palpation: No palpable anomalies              Provocative Test(s):  Phalen's test: deferred Tinel's test: deferred Apley's scratch test (touch opposite shoulder):  Action 1 (Across chest): deferred Action 2 (Overhead): deferred Action 3 (LB reach): deferred   Provocative Test(s):  Phalen's test: deferred Tinel's test: deferred Apley's scratch test (touch opposite shoulder):  Action 1 (Across chest): deferred Action 2 (Overhead): deferred Action 3 (LB reach): deferred    Thoracic Spine Area Exam  Skin & Axial Inspection: No masses, redness, or swelling Alignment: Symmetrical Functional ROM: Unrestricted ROM Stability: No instability detected Muscle Tone/Strength: Functionally intact. No obvious neuro-muscular anomalies detected. Sensory (Neurological): Unimpaired Muscle strength & Tone: No palpable anomalies  Lumbar Spine Area Exam  Skin & Axial Inspection: No masses, redness, or swelling Alignment: Symmetrical Functional ROM: Unrestricted ROM       Stability: No instability detected Muscle Tone/Strength: Functionally intact. No obvious neuro-muscular anomalies detected. Sensory (Neurological): Unimpaired Palpation: No palpable anomalies       Provocative Tests: Hyperextension/rotation test: deferred today       Lumbar quadrant test (  Kemp's test): deferred today       Lateral bending test: deferred today       Patrick's Maneuver: deferred today                   FABER test: deferred today                   S-I anterior distraction/compression test: deferred today         S-I lateral compression test: deferred today         S-I Thigh-thrust  test: deferred today         S-I Gaenslen's test: deferred today          Gait & Posture Assessment  Ambulation: Unassisted Gait: Relatively normal for age and body habitus Posture: WNL   Lower Extremity Exam    Side: Right lower extremity  Side: Left lower extremity  Stability: No instability observed          Stability: No instability observed          Skin & Extremity Inspection: Skin color, temperature, and hair growth are WNL. No peripheral edema or cyanosis. No masses, redness, swelling, asymmetry, or associated skin lesions. No contractures.  Skin & Extremity Inspection: Skin color, temperature, and hair growth are WNL. No peripheral edema or cyanosis. No masses, redness, swelling, asymmetry, or associated skin lesions. No contractures.  Functional ROM: Unrestricted ROM                  Functional ROM: Unrestricted ROM                  Muscle Tone/Strength: Functionally intact. No obvious neuro-muscular anomalies detected.  Muscle Tone/Strength: Functionally intact. No obvious neuro-muscular anomalies detected.  Sensory (Neurological): Unimpaired  Sensory (Neurological): Unimpaired  Palpation: No palpable anomalies  Palpation: No palpable anomalies   Assessment & Plan  Primary Diagnosis & Pertinent Problem List: There were no encounter diagnoses.  Visit Diagnosis: No diagnosis found. 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  Procedure Orders    No procedure(s) ordered today   Lab Orders  No laboratory test(s) ordered today   Imaging Orders  No imaging studies ordered today   Referral Orders  No referral(s) requested today    Pharmacological management options:  Opioid Analgesics: We'll take over management today. See above orders Membrane stabilizer: We have discussed the possibility of optimizing this mode of therapy, if tolerated Muscle relaxant: We have discussed the possibility of a trial NSAID: We have discussed the possibility of a trial Other analgesic(s): To be determined at a later time   Interventional management options: Planned, scheduled, and/or pending:    ***   Considering:   ***   PRN Procedures:   None at this time   Provider-requested follow-up: No follow-ups on file.  No future appointments.  Primary Care Physician: Patient, No Pcp Per  Location: [facility] Note by: Gaspar Cola, MD Date: 08/23/2018; Time: 11:29 AM

## 2018-08-23 ENCOUNTER — Encounter: Payer: Self-pay | Admitting: Pain Medicine

## 2018-08-23 ENCOUNTER — Ambulatory Visit: Payer: Medicaid Other | Admitting: Pain Medicine

## 2018-11-13 ENCOUNTER — Emergency Department: Payer: Medicaid Other

## 2018-11-13 ENCOUNTER — Emergency Department
Admission: EM | Admit: 2018-11-13 | Discharge: 2018-11-13 | Disposition: A | Discharge status: Home / Self Care | Payer: Medicaid Other | Attending: Emergency Medicine | Admitting: Emergency Medicine

## 2018-11-13 DIAGNOSIS — R112 Nausea with vomiting, unspecified: Secondary | ICD-10-CM | POA: Diagnosis not present

## 2018-11-13 DIAGNOSIS — Z79899 Other long term (current) drug therapy: Secondary | ICD-10-CM | POA: Insufficient documentation

## 2018-11-13 DIAGNOSIS — M7918 Myalgia, other site: Secondary | ICD-10-CM | POA: Diagnosis not present

## 2018-11-13 DIAGNOSIS — Z853 Personal history of malignant neoplasm of breast: Secondary | ICD-10-CM | POA: Diagnosis not present

## 2018-11-13 DIAGNOSIS — J101 Influenza due to other identified influenza virus with other respiratory manifestations: Secondary | ICD-10-CM | POA: Insufficient documentation

## 2018-11-13 DIAGNOSIS — R0602 Shortness of breath: Secondary | ICD-10-CM | POA: Diagnosis not present

## 2018-11-13 DIAGNOSIS — I10 Essential (primary) hypertension: Secondary | ICD-10-CM | POA: Diagnosis not present

## 2018-11-13 DIAGNOSIS — R6883 Chills (without fever): Secondary | ICD-10-CM | POA: Diagnosis not present

## 2018-11-13 DIAGNOSIS — F17210 Nicotine dependence, cigarettes, uncomplicated: Secondary | ICD-10-CM | POA: Insufficient documentation

## 2018-11-13 DIAGNOSIS — R740 Nonspecific elevation of levels of transaminase and lactic acid dehydrogenase [LDH]: Secondary | ICD-10-CM | POA: Diagnosis not present

## 2018-11-13 DIAGNOSIS — Z923 Personal history of irradiation: Secondary | ICD-10-CM | POA: Insufficient documentation

## 2018-11-13 DIAGNOSIS — R7401 Elevation of levels of liver transaminase levels: Secondary | ICD-10-CM

## 2018-11-13 DIAGNOSIS — R05 Cough: Secondary | ICD-10-CM | POA: Diagnosis present

## 2018-11-13 LAB — COMPREHENSIVE METABOLIC PANEL
ALBUMIN: 4.3 g/dL (ref 3.5–5.0)
ALT: 145 U/L — ABNORMAL HIGH (ref 0–44)
AST: 116 U/L — ABNORMAL HIGH (ref 15–41)
Alkaline Phosphatase: 78 U/L (ref 38–126)
Anion gap: 6 (ref 5–15)
BUN: 16 mg/dL (ref 8–23)
CO2: 29 mmol/L (ref 22–32)
Calcium: 9.3 mg/dL (ref 8.9–10.3)
Chloride: 101 mmol/L (ref 98–111)
Creatinine, Ser: 0.93 mg/dL (ref 0.44–1.00)
GFR calc Af Amer: >60 mL/min (ref >60)
GFR calc non Af Amer: >60 mL/min (ref >60)
GLUCOSE: 111 mg/dL — AB (ref 70–99)
Potassium: 4.1 mmol/L (ref 3.5–5.1)
Sodium: 136 mmol/L (ref 135–145)
Total Bilirubin: 0.8 mg/dL (ref 0.3–1.2)
Total Protein: 8.1 g/dL (ref 6.5–8.1)

## 2018-11-13 LAB — CBC WITH DIFFERENTIAL/PLATELET
Abs Immature Granulocytes: 0.03 10*3/uL (ref 0.00–0.07)
Basophils Absolute: 0 10*3/uL (ref 0.0–0.1)
Basophils Relative: 0 %
Eosinophils Absolute: 0.1 10*3/uL (ref 0.0–0.5)
Eosinophils Relative: 2 %
HCT: 47.3 % — ABNORMAL HIGH (ref 36.0–46.0)
Hemoglobin: 15.4 g/dL — ABNORMAL HIGH (ref 12.0–15.0)
Immature Granulocytes: 1 %
LYMPHS ABS: 0.8 10*3/uL (ref 0.7–4.0)
Lymphocytes Relative: 14 %
MCH: 31.8 pg (ref 26.0–34.0)
MCHC: 32.6 g/dL (ref 30.0–36.0)
MCV: 97.7 fL (ref 80.0–100.0)
Monocytes Absolute: 0.6 10*3/uL (ref 0.1–1.0)
Monocytes Relative: 10 %
Neutro Abs: 4.4 10*3/uL (ref 1.7–7.7)
Neutrophils Relative %: 73 %
Platelets: 169 10*3/uL (ref 150–400)
RBC: 4.84 MIL/uL (ref 3.87–5.11)
RDW: 12.8 % (ref 11.5–15.5)
WBC: 6.1 10*3/uL (ref 4.0–10.5)
nRBC: 0 % (ref 0.0–0.2)

## 2018-11-13 LAB — URINALYSIS, ROUTINE W REFLEX MICROSCOPIC
Bilirubin Urine: NEGATIVE
Glucose, UA: NEGATIVE mg/dL
Hgb urine dipstick: NEGATIVE
Ketones, ur: 5 mg/dL — AB
Leukocytes, UA: NEGATIVE
Nitrite: NEGATIVE
Protein, ur: NEGATIVE mg/dL
SPECIFIC GRAVITY, URINE: 1.013 (ref 1.005–1.030)
pH: 6 (ref 5.0–8.0)

## 2018-11-13 LAB — INFLUENZA PANEL BY PCR (TYPE A & B)
Influenza A By PCR: NEGATIVE
Influenza B By PCR: POSITIVE — AB

## 2018-11-13 LAB — LACTIC ACID, PLASMA
Lactic Acid, Venous: 1.3 mmol/L (ref 0.5–1.9)
Lactic Acid, Venous: 1.7 mmol/L (ref 0.5–1.9)

## 2018-11-13 MED ORDER — METHYLPREDNISOLONE SODIUM SUCC 125 MG IJ SOLR
125.0000 mg | Freq: Once | INTRAMUSCULAR | Status: AC
  Administered 2018-11-13: 125 mg via INTRAVENOUS
  Filled 2018-11-13: qty 2

## 2018-11-13 MED ORDER — ONDANSETRON 4 MG PO TBDP: 4.0000 mg | ORAL_TABLET | Freq: Three times a day (TID) | ORAL | 0 refills | Status: DC | PRN

## 2018-11-13 MED ORDER — ACETAMINOPHEN 500 MG PO TABS
1000.0000 mg | ORAL_TABLET | Freq: Once | ORAL | Status: AC
  Administered 2018-11-13: 1000 mg via ORAL
  Filled 2018-11-13: qty 2

## 2018-11-13 MED ORDER — IPRATROPIUM-ALBUTEROL 0.5-2.5 (3) MG/3ML IN SOLN
3.0000 mL | Freq: Once | RESPIRATORY_TRACT | Status: AC
  Administered 2018-11-13: 3 mL via RESPIRATORY_TRACT

## 2018-11-13 MED ORDER — ONDANSETRON HCL 4 MG/2ML IJ SOLN
4.0000 mg | Freq: Once | INTRAMUSCULAR | Status: AC
  Administered 2018-11-13: 4 mg via INTRAVENOUS
  Filled 2018-11-13: qty 2

## 2018-11-13 MED ORDER — SODIUM CHLORIDE 0.9 % IV BOLUS
1000.0000 mL | Freq: Once | INTRAVENOUS | Status: AC
  Administered 2018-11-13: 1000 mL via INTRAVENOUS

## 2018-11-13 MED ORDER — KETOROLAC TROMETHAMINE 30 MG/ML IJ SOLN
15.0000 mg | Freq: Once | INTRAMUSCULAR | Status: AC
  Administered 2018-11-13: 15 mg via INTRAVENOUS
  Filled 2018-11-13: qty 1

## 2018-11-13 MED ORDER — IPRATROPIUM-ALBUTEROL 0.5-2.5 (3) MG/3ML IN SOLN
3.0000 mL | Freq: Once | RESPIRATORY_TRACT | Status: AC
  Administered 2018-11-13: 3 mL via RESPIRATORY_TRACT
  Filled 2018-11-13: qty 3

## 2018-11-13 NOTE — ED Provider Notes (Signed)
[facility] Emergency Department Provider Note  ____________________________________________  Time seen: Approximately 6:38 PM  I have reviewed the triage vital signs and the nursing notes.   HISTORY  Chief Complaint No chief complaint on file.   HPI ADRIEANA FENNELLY is a 63 y.o. female with a history of remote breast cancer, hypertension who presents for evaluation of flulike symptoms.  Patient reports 3 days of chills, productive cough, nausea, and several daily episodes of nonbloody nonbilious emesis.  No diarrhea, no abdominal pain, no chest pain.  Patient does report mild shortness of breath.  Also complaining of body aches.  Patient has not received a flu shot this year.  Patient has no history of COPD but she is a smoker.  Past Medical History:  Diagnosis Date  . Breast cancer [facility]) 2013   Right breast cancer - Radiation  . Breast lump 2014  . 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 Hypertension   . Kidney stone     Patient Active Problem List   Diagnosis Date Noted  . Cocaine use 08/09/2018  . 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 Chronic lower extremity pain (Secondary Area of Pain) (Right) 08/03/2018  . Chronic sacroiliac joint pain (Left) 08/03/2018  . Chronic pain syndrome 08/03/2018  . Pharmacologic therapy 08/03/2018  . Disorder of skeletal system 08/03/2018  . Problems influencing health status 08/03/2018  . Lower extremity weakness (bilateral) 08/03/2018  . UTI (lower urinary tract infection) 02/03/2016  . Breast mass, right 08/12/2015  . DCIS (ductal carcinoma in situ) of breast 05/10/2013    Past Surgical History:  Procedure Laterality Date  . BREAST BIOPSY Right 08/12/2015   US guided biopsy w/ clip placement/SPINDLE CELL PROLIFERATION WITH CYTOLOGIC ATYPIA  . BREAST LUMPECTOMY Right 08/29/2015   Procedure: BREAST  LUMPECTOMY;  Surgeon: Christene Lye, MD;  Location: [facility] ORS;  Service: General;  Laterality: Right;  . BREAST SURGERY Right 05-16-2013   lumpectomy  . SHOULDER ARTHROSCOPY Right 1996  . [place]   . WISDOM TOOTH EXTRACTION      Prior to Admission medications   Medication Sig Start Date End Date Taking? Authorizing Provider  amLODipine (NORVASC) 5 MG tablet Take 1 tablet (5 mg total) by mouth daily. In am 05/16/18 08/03/18  Hinda Kehr, MD  gabapentin (NEURONTIN) 300 MG capsule Take 1 capsule (300 mg total) by mouth 3 (three) times daily. 06/29/18 06/29/19  Darel Hong, MD  omeprazole (PRILOSEC) 20 MG capsule Take 20 mg by mouth daily. As needed.    [provider]  ondansetron (ZOFRAN ODT) 4 MG disintegrating tablet Take 1 tablet (4 mg total) by mouth every 8 (eight) hours as needed. 11/13/18   Rudene Re, MD    Allergies Hydrocodone; Morphine and related; and Motrin [ibuprofen]  Family History  Problem Relation Age of Onset  . Breast cancer Mother   . Cancer Paternal Aunt 68       small bowel  . Breast cancer Maternal Aunt 75  . Breast cancer Sister        younger than age 72  . Breast cancer Sister        younger than age 76  . Breast cancer Maternal Aunt 68  . Breast cancer Cousin 5  . Prostate cancer Brother 40  . Diabetes Paternal Grandmother   . Ovarian  cancer Neg Hx     Social History Social History   Tobacco Use  . Smoking status: Current Every Day Smoker    Packs/day: 0.25    Years: 20.00    Pack years: 5.00    Types: Cigarettes  . Smokeless tobacco: Never Used  Substance Use Topics  . Alcohol use: Yes    Alcohol/week: 0.0 - 1.0 standard drinks    Comment: "occassional alcohol use"  . Drug use: No    Review of Systems  Constitutional: Negative for fever. + chills, body aches Eyes: Negative for visual changes. ENT: Negative for sore throat. Neck: No neck pain  Cardiovascular: Negative for chest  pain. Respiratory: + shortness of breath, cough Gastrointestinal: Negative for abdominal pain or diarrhea. + N/V Genitourinary: Negative for dysuria. Musculoskeletal: Negative for back pain. Skin: Negative for rash. Neurological: Negative for headaches, weakness or numbness. Psych: No SI or HI  ____________________________________________   PHYSICAL EXAM:  VITAL SIGNS: ED Triage Vitals  Enc Vitals Group     BP 11/13/18 1814 (!) 187/114     Pulse Rate 11/13/18 1814 95     Resp 11/13/18 1814 (!) 22     Temp 11/13/18 1814 (!) 100.4 F (38 C)     Temp Source 11/13/18 1814 Oral     SpO2 11/13/18 1814 95 %     Weight 11/13/18 1818 145 lb (65.8 kg)     Height 11/13/18 1818 5' (1.524 m)     Head Circumference --      Peak Flow --      Pain Score 11/13/18 1817 9     Pain Loc --      Pain Edu? --      Excl. in [place]? --     Constitutional: Alert and oriented. Well appearing and in no apparent distress. HEENT:      Head: Normocephalic and atraumatic.         Eyes: Conjunctivae are normal. Sclera is non-icteric.       Mouth/Throat: Mucous membranes are moist.       Neck: Supple with no signs of meningismus. Cardiovascular: Regular rate and rhythm. No murmurs, gallops, or rubs. 2+ symmetrical distal pulses are present in all extremities. No JVD. Respiratory: Tachypnea, slight increased work of breathing, normal sats, severely diminished air movement bilaterally with no crackles or wheezes  gastrointestinal: Soft, non tender, and non distended with positive bowel sounds. No rebound or guarding. Musculoskeletal: Nontender with normal range of motion in all extremities. No edema, cyanosis, or erythema of extremities. Neurologic: Normal speech and language. Face is symmetric. Moving all extremities. No gross focal neurologic deficits are appreciated. Skin: Skin is warm, dry and intact. No rash noted. Psychiatric: Mood and affect are normal. Speech and behavior are  normal.  ____________________________________________   LABS (all labs ordered are listed, but only abnormal results are displayed)  Labs Reviewed  COMPREHENSIVE METABOLIC PANEL - Abnormal; Notable for the following components:      Result Value   Glucose, Bld 111 (*)    AST 116 (*)    ALT 145 (*)    All other components within normal limits  CBC WITH DIFFERENTIAL/PLATELET - Abnormal; Notable for the following components:   Hemoglobin 15.4 (*)    HCT 47.3 (*)    All other components within normal limits  INFLUENZA PANEL BY PCR (TYPE A & B) - Abnormal; Notable for the following components:   Influenza B By PCR POSITIVE (*)    All other  components within normal limits  LACTIC ACID, PLASMA  LACTIC ACID, PLASMA  URINALYSIS, ROUTINE W REFLEX MICROSCOPIC   ____________________________________________  EKG  ED ECG REPORT I, Rudene Re, the attending physician, personally viewed and interpreted this ECG.  Sinus rhythm, rate of 97, normal intervals, normal axis, no ST elevations, mild ST depression lateral leads which are new when compared to prior. ____________________________________________  RADIOLOGY  I have personally reviewed the images performed during this visit and I agree with the Radiologist's read.   Interpretation by Radiologist:  Dg Chest 2 View  Result Date: 11/13/2018 CLINICAL DATA:  Cough, chills, headache EXAM: CHEST - 2 VIEW COMPARISON:  04/08/2011 FINDINGS: Heart and mediastinal contours are within normal limits. No focal opacities or effusions. No acute bony abnormality. IMPRESSION: No active cardiopulmonary disease. Electronically Signed   By: Rolm Baptise M.D.   On: 11/13/2018 18:54      ____________________________________________   PROCEDURES  Procedure(s) performed: None Procedures Critical Care performed:  None ____________________________________________   INITIAL IMPRESSION / ASSESSMENT AND PLAN / ED COURSE  63 y.o. female with a  history of remote breast cancer, hypertension who presents for evaluation of flulike symptoms x 3 days.  Patient slightly tachypneic with normal oxygen requirement, has a temp of 100.42F.  No tachycardia.  Decreased air movement bilaterally with no wheezing or crackles.  Patient does not have a history of COPD but is a smoker therefore will give duo nebs and Solu-Medrol.  Will get chest x-ray to rule out pneumonia.  Will swab patient for the flu.  Will give IV fluids, Zofran, and Tylenol.  Clinical Course as of Nov 13 2109  Mon Nov 13, 2018  2027 Influenza B positive.  Outside the window of Tamiflu.  Patient also has mild elevated LFTs which are new.  She feels markedly improved after fluids and Toradol.  At this time will discharge home with follow-up with primary care doctor for evaluation of liver enzymes.   [CV]    Clinical Course User Index [CV] Alfred Levins [place], MD    As part of my medical decision making, I reviewed the following data within the [place] notes reviewed and incorporated, Labs reviewed , EKG interpreted , Old EKG reviewed, Old chart reviewed, Radiograph reviewed , Notes from prior ED visits and [place] Controlled Substance Database    Pertinent labs & imaging results that were available during my care of the patient were reviewed by me and considered in my medical decision making (see chart for details).    ____________________________________________   FINAL CLINICAL IMPRESSION(S) / ED DIAGNOSES  Final diagnoses:  Influenza B  Transaminitis      NEW MEDICATIONS STARTED DURING THIS VISIT:  ED Discharge Orders         Ordered    ondansetron (ZOFRAN ODT) 4 MG disintegrating tablet  Every 8 hours PRN     11/13/18 2111           Note:  This document was prepared using Dragon voice recognition software and may include unintentional dictation errors.    Rudene Re, MD 11/13/18 2112

## 2018-11-13 NOTE — Discharge Instructions (Signed)
Siga con su mdico en 1 semana para la evaluacin de las enzimas hepticas elevadas. Beba mucho lquido. Tome zofran segn sea necesario para las nuseas y los vmitos. Regrese a urgencias para empeorar la dificultad para respirar, fiebre, dolor en el pecho o dolor abdominal.   Follow up with your doctor in 1 week for evaluation of elevated liver enzymes. Drink plenty of fluids. Take zofran as needed for nausea and vomiting. Return to the ER for worsening shortness of breath, fever, chest pain, or abdominal pain.

## 2019-10-10 ENCOUNTER — Emergency Department
Admission: EM | Admit: 2019-10-10 | Discharge: 2019-10-11 | Disposition: A | Discharge status: Home / Self Care | Payer: Medicaid Other | Attending: Emergency Medicine | Admitting: Emergency Medicine

## 2019-10-10 ENCOUNTER — Other Ambulatory Visit: Payer: Self-pay

## 2019-10-10 ENCOUNTER — Emergency Department: Payer: Medicaid Other

## 2019-10-10 DIAGNOSIS — F17210 Nicotine dependence, cigarettes, uncomplicated: Secondary | ICD-10-CM | POA: Insufficient documentation

## 2019-10-10 DIAGNOSIS — R109 Unspecified abdominal pain: Secondary | ICD-10-CM | POA: Diagnosis present

## 2019-10-10 DIAGNOSIS — M549 Dorsalgia, unspecified: Secondary | ICD-10-CM | POA: Insufficient documentation

## 2019-10-10 DIAGNOSIS — Z79899 Other long term (current) drug therapy: Secondary | ICD-10-CM | POA: Insufficient documentation

## 2019-10-10 DIAGNOSIS — I10 Essential (primary) hypertension: Secondary | ICD-10-CM | POA: Diagnosis not present

## 2019-10-10 DIAGNOSIS — N3001 Acute cystitis with hematuria: Secondary | ICD-10-CM | POA: Insufficient documentation

## 2019-10-10 DIAGNOSIS — Z853 Personal history of malignant neoplasm of breast: Secondary | ICD-10-CM | POA: Diagnosis not present

## 2019-10-10 DIAGNOSIS — N200 Calculus of kidney: Secondary | ICD-10-CM | POA: Diagnosis not present

## 2019-10-10 DIAGNOSIS — R300 Dysuria: Secondary | ICD-10-CM | POA: Insufficient documentation

## 2019-10-10 LAB — BASIC METABOLIC PANEL
Anion gap: 14 (ref 5–15)
BUN: 19 mg/dL (ref 8–23)
CO2: 23 mmol/L (ref 22–32)
Calcium: 9.7 mg/dL (ref 8.9–10.3)
Chloride: 100 mmol/L (ref 98–111)
Creatinine, Ser: 1.77 mg/dL — ABNORMAL HIGH (ref 0.44–1.00)
GFR calc Af Amer: 35 mL/min — ABNORMAL LOW (ref >60)
GFR calc non Af Amer: 30 mL/min — ABNORMAL LOW (ref >60)
Glucose, Bld: 194 mg/dL — ABNORMAL HIGH (ref 70–99)
Potassium: 3.9 mmol/L (ref 3.5–5.1)
Sodium: 137 mmol/L (ref 135–145)

## 2019-10-10 LAB — URINALYSIS, COMPLETE (UACMP) WITH MICROSCOPIC
Bilirubin Urine: NEGATIVE
Glucose, UA: NEGATIVE mg/dL
Ketones, ur: NEGATIVE mg/dL
Nitrite: NEGATIVE
Protein, ur: 100 mg/dL — AB
RBC / HPF: >50 RBC/hpf — ABNORMAL HIGH (ref 0–5)
Specific Gravity, Urine: 1.016 (ref 1.005–1.030)
WBC, UA: >50 WBC/hpf — ABNORMAL HIGH (ref 0–5)
pH: 5 (ref 5.0–8.0)

## 2019-10-10 LAB — CBC
HCT: 46.6 % — ABNORMAL HIGH (ref 36.0–46.0)
Hemoglobin: 16.1 g/dL — ABNORMAL HIGH (ref 12.0–15.0)
MCH: 32.3 pg (ref 26.0–34.0)
MCHC: 34.5 g/dL (ref 30.0–36.0)
MCV: 93.4 fL (ref 80.0–100.0)
Platelets: 246 10*3/uL (ref 150–400)
RBC: 4.99 MIL/uL (ref 3.87–5.11)
RDW: 12.9 % (ref 11.5–15.5)
WBC: 11.8 10*3/uL — ABNORMAL HIGH (ref 4.0–10.5)
nRBC: 0 % (ref 0.0–0.2)

## 2019-10-10 MED ORDER — SODIUM CHLORIDE 0.9 % IV SOLN
1.0000 g | Freq: Once | INTRAVENOUS | Status: AC
  Administered 2019-10-11: 1 g via INTRAVENOUS
  Filled 2019-10-10: qty 10

## 2019-10-10 MED ORDER — KETOROLAC TROMETHAMINE 30 MG/ML IJ SOLN
30.0000 mg | Freq: Once | INTRAMUSCULAR | Status: AC
  Administered 2019-10-11: 30 mg via INTRAVENOUS
  Filled 2019-10-10: qty 1

## 2019-10-10 MED ORDER — SODIUM CHLORIDE 0.9 % IV BOLUS: 1000.0000 mL | Freq: Once | INTRAVENOUS | Status: DC

## 2019-10-10 NOTE — ED Provider Notes (Signed)
[facility] Emergency Department Provider Note  ____________________________________________   First MD Initiated Contact with Patient 10/10/19 2347     (approximate)  I have reviewed the triage vital signs and the nursing notes.   HISTORY  Chief Complaint Flank Pain   HPI Tricia Wilson is a 63 y.o. female with below list of previous medical conditions including kidney stone presents to the emergency department secondary to dysuria which patient states began yesterday with accompanying right side mid back pain.  Patient states that current pain score is 10 out of 10.  Patient states that symptoms are consistent when she had a previous kidney stone 23 years ago.  Patient denies any fever.  Patient denies any nausea or vomiting.        Past Medical History:  Diagnosis Date  . Breast cancer [facility]) 2013   Right breast cancer - Radiation  . Breast lump 2014  . 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 Hypertension   . Kidney stone     Patient Active Problem List   Diagnosis Date Noted  . Cocaine use 08/09/2018  . 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 Chronic lower extremity pain (Secondary Area of Pain) (Right) 08/03/2018  . Chronic sacroiliac joint pain (Left) 08/03/2018  . Chronic pain syndrome 08/03/2018  . Pharmacologic therapy 08/03/2018  . Disorder of skeletal system 08/03/2018  . Problems influencing health status 08/03/2018  . Lower extremity weakness (bilateral) 08/03/2018  . UTI (lower urinary tract infection) 02/03/2016  . Breast mass, right 08/12/2015  . DCIS (ductal carcinoma in situ) of breast 05/10/2013    Past Surgical History:  Procedure Laterality Date  . BREAST BIOPSY Right 08/12/2015   US guided biopsy w/ clip placement/SPINDLE CELL PROLIFERATION WITH CYTOLOGIC ATYPIA  . BREAST LUMPECTOMY Right 08/29/2015   Procedure:  BREAST LUMPECTOMY;  Surgeon: Christene Lye, MD;  Location: [facility] ORS;  Service: General;  Laterality: Right;  . BREAST SURGERY Right 05-16-2013   lumpectomy  . SHOULDER ARTHROSCOPY Right 1996  . [place]   . WISDOM TOOTH EXTRACTION      Prior to Admission medications   Medication Sig Start Date End Date Taking? Authorizing Provider  amLODipine (NORVASC) 5 MG tablet Take 1 tablet (5 mg total) by mouth daily. In am 05/16/18 08/03/18  Hinda Kehr, MD  gabapentin (NEURONTIN) 300 MG capsule Take 1 capsule (300 mg total) by mouth 3 (three) times daily. 06/29/18 06/29/19  Darel Hong, MD  omeprazole (PRILOSEC) 20 MG capsule Take 20 mg by mouth daily. As needed.    [provider]  ondansetron (ZOFRAN ODT) 4 MG disintegrating tablet Take 1 tablet (4 mg total) by mouth every 8 (eight) hours as needed. 11/13/18   Rudene Re, MD    Allergies Hydrocodone, Morphine and related, and Motrin [ibuprofen]  Family History  Problem Relation Age of Onset  . Breast cancer Mother   . Cancer Paternal Aunt 40       small bowel  . Breast cancer Maternal Aunt 55  . Breast cancer Sister        younger than age 23  . Breast cancer Sister        younger than age 71  . Breast cancer Maternal Aunt 27  . Breast cancer Cousin 34  . Prostate cancer Brother 82  . Diabetes Paternal  Grandmother   . Ovarian cancer Neg Hx     Social History Social History   Tobacco Use  . Smoking status: Current Every Day Smoker    Packs/day: 0.25    Years: 20.00    Pack years: 5.00    Types: Cigarettes  . Smokeless tobacco: Never Used  Substance Use Topics  . Alcohol use: Yes    Alcohol/week: 0.0 - 1.0 standard drinks    Comment: "occassional alcohol use"  . Drug use: No    Review of Systems Constitutional: No fever/chills Eyes: No visual changes. ENT: No sore throat. Cardiovascular: Denies chest pain. Respiratory: Denies shortness of breath. Gastrointestinal:  Positive for right flank pain no nausea, no vomiting.  No diarrhea.  No constipation. Genitourinary: Negative for dysuria. Musculoskeletal: Negative for neck pain.  Negative for back pain. Integumentary: Negative for rash. Neurological: Negative for headaches, focal weakness or numbness.  ____________________________________________   PHYSICAL EXAM:  VITAL SIGNS: ED Triage Vitals  Enc Vitals Group     BP 10/10/19 1904 130/75     Pulse Rate 10/10/19 1904 97     Resp 10/10/19 1904 (!) 22     Temp 10/10/19 1904 99 F (37.2 C)     Temp src --      SpO2 10/10/19 1904 95 %     Weight 10/10/19 1906 63.5 kg (140 lb)     Height 10/10/19 1906 1.524 m (5')     Head Circumference --      Peak Flow --      Pain Score 10/10/19 1905 10     Pain Loc --      Pain Edu? --      Excl. in [place]? --     Constitutional: Alert and oriented.  Eyes: Conjunctivae are normal.  Mouth/Throat: Patient is wearing a mask. Neck: No stridor.  No meningeal signs.   Cardiovascular: Normal rate, regular rhythm. Good peripheral circulation. Grossly normal heart sounds. Respiratory: Normal respiratory effort.  No retractions. Gastrointestinal: Soft and nontender. No distention.  Musculoskeletal: No lower extremity tenderness nor edema. No gross deformities of extremities. Neurologic:  Normal speech and language. No gross focal neurologic deficits are appreciated.  Skin:  Skin is warm, dry and intact. Psychiatric: Mood and affect are normal. Speech and behavior are normal.  ____________________________________________   LABS (all labs ordered are listed, but only abnormal results are displayed)  Labs Reviewed  URINALYSIS, COMPLETE (UACMP) WITH MICROSCOPIC - Abnormal; Notable for the following components:      Result Value   Color, Urine AMBER (*)    APPearance TURBID (*)    Hgb urine dipstick SMALL (*)    Protein, ur 100 (*)    Leukocytes,Ua MODERATE (*)    RBC / HPF >50 (*)    WBC, UA >50 (*)     Bacteria, UA MANY (*)    All other components within normal limits  BASIC METABOLIC PANEL - Abnormal; Notable for the following components:   Glucose, Bld 194 (*)    Creatinine, Ser 1.77 (*)    GFR calc non Af Amer 30 (*)    GFR calc Af Amer 35 (*)    All other components within normal limits  CBC - Abnormal; Notable for the following components:   WBC 11.8 (*)    Hemoglobin 16.1 (*)    HCT 46.6 (*)    All other components within normal limits  TROPONIN I (HIGH SENSITIVITY)   ____________________  RADIOLOGY I, [place] Ernst Bowler, personally  viewed and evaluated these images (plain radiographs) as part of my medical decision making, as well as reviewing the written report by the radiologist.  ED MD interpretation: Slight asymmetric prominence of the right ureter without frank hydronephrosis or obstructing calculus could reflect a recently passed stone per radiologist on CT scan interpretation.  Official radiology report(s): CT Renal Stone Study  Result Date: 10/10/2019 CLINICAL DATA:  Flank pain, kidney stone suspected EXAM: CT ABDOMEN AND PELVIS WITHOUT CONTRAST TECHNIQUE: Multidetector CT imaging of the abdomen and pelvis was performed following the standard protocol without IV contrast. COMPARISON:  CT 08/08/2018 FINDINGS: Lower chest: Small air cyst noted in the left lung base. Dependent atelectatic changes are seen posteriorly. Trace pericardial fluid within normal limits. Normal heart size. Hepatobiliary: Diffuse hepatic hypoattenuation compatible with hepatic steatosis. Some focal sparing noted along the gallbladder fossa. Lobular cystic foci in the left lobe liver are grossly unchanged from comparison studies. Largest measuring up to 4.7 cm in size. No new or worrisome hepatic lesions are seen. No calcified gallstones, pericholecystic fluid or inflammation or gallbladder wall thickening. No biliary ductal dilatation. Pancreas: Unremarkable. No pancreatic ductal dilatation or  surrounding inflammatory changes. Spleen: Normal in size without focal abnormality. Adrenals/Urinary Tract: Normal adrenal glands. A 6 mm partially exophytic cystic structure extending from the upper pole left kidney now appears somewhat hyperattenuating on this exam favoring a hemorrhagic cyst. No other new or suspicious renal lesions. Multiple nonobstructing calculi present in the right renal pelvis. Slight asymmetric prominence of the right ureter without frank hydronephrosis or obstructing calculus. Urinary bladder is largely decompressed at the time of exam and therefore poorly evaluated by CT imaging. Stomach/Bowel: Distal esophagus, stomach and duodenal sweep are unremarkable. No small bowel wall thickening or dilatation. No evidence of obstruction. A normal appendix is visualized. No colonic dilatation or wall thickening. Scattered colonic diverticula without focal pericolonic inflammation to suggest diverticulitis. Vascular/Lymphatic: Atherosclerotic plaque within the normal caliber aorta. No suspicious or enlarged lymph nodes in the included lymphatic chains. Reproductive: Anteverted uterus. No concerning adnexal lesions. Other: No free fluid. No free air. No bowel containing hernias. Musculoskeletal: Multilevel degenerative changes are similar to comparison studies. No acute osseous abnormality or suspicious osseous lesion. IMPRESSION: 1. Slight asymmetric prominence of the right ureter without frank hydronephrosis or obstructing calculus. Could reflect a recently passed stone. Correlate with patient's symptoms and urinalysis. 2. Multiple additional nonobstructing calculi in the right renal pelvis. 3. Increasing attenuation of a 6 mm cystic focus in the upper pole left kidney, likely reflecting hemorrhage within a cyst though incompletely characterized on this exam. Could consider outpatient, nonemergent renal ultrasound. 4. Stable lobular cystic foci in the liver, probable benign hepatic cysts. 5.  Colonic diverticulosis without evidence of diverticulitis. 6. Hepatic steatosis. 7. Aortic Atherosclerosis (ICD10-I70.0). Electronically Signed   By: Lovena Le M.D.   On: 10/10/2019 21:45    ____________________________________________   PROCEDURES   Procedure(s) performed (including Critical Care):  Procedures   ____________________________________________   INITIAL IMPRESSION / MDM / [place] / ED COURSE  As part of my medical decision making, I reviewed the following data within the electronic MEDICAL RECORD NUMBER  63 year old female presented with above-stated history and physical exam with differential diagnosis including but not limited to ureterolithiasis pyelonephritis cystitis or combination thereof.  Urinalysis consistent with a urinary tract infection.  CT scan revealed evidence of possible recently passed stone.  Of note the patient's creatinine is 1.77 with comparison January 2020 creatinine of 0.93.  Patient  given 1 L IV normal saline 30 mg of Toradol IV with improvement of pain.  In addition patient was given 1 g of ceftriaxone and will be prescribed Keflex for home.  Patient informed of warning signs that would warrant immediate return to the emergency department.   ____________________________________________  FINAL CLINICAL IMPRESSION(S) / ED DIAGNOSES  Final diagnoses:  Kidney stone  Acute cystitis with hematuria     MEDICATIONS GIVEN DURING THIS VISIT:  Medications  sodium chloride 0.9 % bolus 1,000 mL (has no administration in time range)  cefTRIAXone (ROCEPHIN) 1 g in sodium chloride 0.9 % 100 mL IVPB (has no administration in time range)  ketorolac (TORADOL) 30 MG/ML injection 30 mg (has no administration in time range)     ED Discharge Orders    None      *Please note:  Tricia Wilson was evaluated in Emergency Department on 10/10/2019 for the symptoms described in the history of present illness. She was evaluated in the context of the  global COVID-19 pandemic, which necessitated consideration that the patient might be at risk for infection with the SARS-CoV-2 virus that causes COVID-19. Institutional protocols and algorithms that pertain to the evaluation of patients at risk for COVID-19 are in a state of rapid change based on information released by regulatory bodies including the CDC and federal and state organizations. These policies and algorithms were followed during the patient's care in the ED.  Some ED evaluations and interventions may be delayed as a result of limited staffing during the pandemic.*  Note:  This document was prepared using Dragon voice recognition software and may include unintentional dictation errors.   Gregor Hams, MD 10/11/19 [phone]

## 2019-10-10 NOTE — ED Triage Notes (Signed)
Pt to ED via EMS from home c/o flank pain that radiates to lower abdomen starting last night. HX of kidney stones. No fever, c/o dysuria. N/V and dizziness.

## 2019-10-10 NOTE — ED Triage Notes (Signed)
Pt in via EMS from home with c/o kidney pain. EMS reports pt with hx of stones. Pt also with cough and states she has been tested several times all negative.

## 2019-10-11 LAB — TROPONIN I (HIGH SENSITIVITY): Troponin I (High Sensitivity): 6 ng/L (ref <18)

## 2019-10-11 MED ORDER — CEPHALEXIN 500 MG PO CAPS: 500.0000 mg | ORAL_CAPSULE | Freq: Two times a day (BID) | ORAL | 0 refills | Status: AC

## 2019-10-11 MED ORDER — OXYCODONE-ACETAMINOPHEN 5-325 MG PO TABS: 1.0000 | ORAL_TABLET | ORAL | 0 refills | Status: AC | PRN

## 2019-12-05 ENCOUNTER — Other Ambulatory Visit: Payer: Self-pay

## 2019-12-05 ENCOUNTER — Emergency Department
Admission: EM | Admit: 2019-12-05 | Discharge: 2019-12-05 | Disposition: A | Discharge status: Home / Self Care | Payer: Medicaid Other | Attending: Emergency Medicine | Admitting: Emergency Medicine

## 2019-12-05 ENCOUNTER — Emergency Department: Payer: Medicaid Other

## 2019-12-05 DIAGNOSIS — I10 Essential (primary) hypertension: Secondary | ICD-10-CM | POA: Insufficient documentation

## 2019-12-05 DIAGNOSIS — R05 Cough: Secondary | ICD-10-CM | POA: Diagnosis present

## 2019-12-05 DIAGNOSIS — J40 Bronchitis, not specified as acute or chronic: Secondary | ICD-10-CM | POA: Diagnosis not present

## 2019-12-05 DIAGNOSIS — N3000 Acute cystitis without hematuria: Secondary | ICD-10-CM | POA: Insufficient documentation

## 2019-12-05 DIAGNOSIS — Z79899 Other long term (current) drug therapy: Secondary | ICD-10-CM | POA: Insufficient documentation

## 2019-12-05 DIAGNOSIS — Z853 Personal history of malignant neoplasm of breast: Secondary | ICD-10-CM | POA: Insufficient documentation

## 2019-12-05 DIAGNOSIS — F17210 Nicotine dependence, cigarettes, uncomplicated: Secondary | ICD-10-CM | POA: Insufficient documentation

## 2019-12-05 DIAGNOSIS — J029 Acute pharyngitis, unspecified: Secondary | ICD-10-CM

## 2019-12-05 LAB — URINALYSIS, COMPLETE (UACMP) WITH MICROSCOPIC
Bilirubin Urine: NEGATIVE
Glucose, UA: 50 mg/dL — AB
Hgb urine dipstick: NEGATIVE
Ketones, ur: NEGATIVE mg/dL
Nitrite: NEGATIVE
Protein, ur: NEGATIVE mg/dL
Specific Gravity, Urine: 1.01 (ref 1.005–1.030)
pH: 7 (ref 5.0–8.0)

## 2019-12-05 LAB — GROUP A STREP BY PCR: Group A Strep by PCR: NOT DETECTED

## 2019-12-05 MED ORDER — CEPHALEXIN 500 MG PO CAPS: 500.0000 mg | ORAL_CAPSULE | Freq: Four times a day (QID) | ORAL | 0 refills | Status: DC

## 2019-12-05 MED ORDER — OXYCODONE-ACETAMINOPHEN 5-325 MG PO TABS
1.0000 | ORAL_TABLET | Freq: Once | ORAL | Status: AC
  Administered 2019-12-05: 1 via ORAL
  Filled 2019-12-05: qty 1

## 2019-12-05 MED ORDER — AMLODIPINE BESYLATE 5 MG PO TABS
5.0000 mg | ORAL_TABLET | Freq: Once | ORAL | Status: AC
  Administered 2019-12-05: 5 mg via ORAL
  Filled 2019-12-05: qty 1

## 2019-12-05 MED ORDER — PREDNISONE 50 MG PO TABS: 50.0000 mg | ORAL_TABLET | Freq: Every day | ORAL | 0 refills | Status: AC

## 2019-12-05 MED ORDER — OXYCODONE-ACETAMINOPHEN 5-325 MG PO TABS: 1.0000 | ORAL_TABLET | Freq: Four times a day (QID) | ORAL | 0 refills | Status: DC | PRN

## 2019-12-05 MED ORDER — BENZONATATE 100 MG PO CAPS: 100.0000 mg | ORAL_CAPSULE | Freq: Four times a day (QID) | ORAL | 0 refills | Status: AC | PRN

## 2019-12-05 MED ORDER — ALBUTEROL SULFATE HFA 108 (90 BASE) MCG/ACT IN AERS: 2.0000 | INHALATION_SPRAY | RESPIRATORY_TRACT | 0 refills | Status: AC | PRN

## 2019-12-05 MED ORDER — CEPHALEXIN 500 MG PO CAPS
500.0000 mg | ORAL_CAPSULE | Freq: Once | ORAL | Status: AC
  Administered 2019-12-05: 500 mg via ORAL
  Filled 2019-12-05: qty 1

## 2019-12-05 MED ORDER — AMLODIPINE BESYLATE 5 MG PO TABS: 5.0000 mg | ORAL_TABLET | Freq: Every day | ORAL | 11 refills | Status: AC

## 2019-12-05 MED ORDER — PREDNISONE 20 MG PO TABS
60.0000 mg | ORAL_TABLET | Freq: Once | ORAL | Status: AC
  Administered 2019-12-05: 60 mg via ORAL
  Filled 2019-12-05: qty 3

## 2019-12-05 NOTE — ED Triage Notes (Signed)
Pt comes via POV from home with c/o cough for few days and sore throat.  Pt states some pain in left side from coughing.

## 2019-12-05 NOTE — ED Provider Notes (Signed)
[facility] Emergency Department Provider Note  ____________________________________________  Time seen: Approximately 7:13 PM  I have reviewed the triage vital signs and the nursing notes.   HISTORY  Chief Complaint Cough    HPI Tricia Wilson is a 64 y.o. female who presents the emergency department for evaluation for 3 complaints.  Patient is primarily complaining of coughing.  Patient states that she has a history of asthma but has had significant increase in coughing over the past several days.  Patient is also noticed sore throat for same time..  Patient denies any difficulty breathing or swallowing.  No fever chills, nasal congestion.  No shortness of breath.  Patient is also concerned as she is having burning with urination.  Patient was diagnosed with a kidney stone several months ago, had a UTI at the same time.  She states that she took her antibiotics.  She is now experiencing return of burning with urination.  No hematuria.  Patient states that she will have some right-sided "lung pain" with coughing only.  She denies any flank pain or abdominal pain.         Past Medical History:  Diagnosis Date  . Breast cancer [facility]) 2013   Right breast cancer - Radiation  . Breast lump 2014  . 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 Hypertension   . Kidney stone     Patient Active Problem List   Diagnosis Date Noted  . Cocaine use 08/09/2018  . 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 Chronic lower extremity pain (Secondary Area of Pain) (Right) 08/03/2018  . Chronic sacroiliac joint pain (Left) 08/03/2018  . Chronic pain syndrome 08/03/2018  . Pharmacologic therapy 08/03/2018  . Disorder of skeletal system 08/03/2018  . Problems influencing health status 08/03/2018  . Lower extremity weakness (bilateral) 08/03/2018  . UTI (lower urinary tract  infection) 02/03/2016  . Breast mass, right 08/12/2015  . DCIS (ductal carcinoma in situ) of breast 05/10/2013    Past Surgical History:  Procedure Laterality Date  . BREAST BIOPSY Right 08/12/2015   US guided biopsy w/ clip placement/SPINDLE CELL PROLIFERATION WITH CYTOLOGIC ATYPIA  . BREAST LUMPECTOMY Right 08/29/2015   Procedure: BREAST LUMPECTOMY;  Surgeon: Christene Lye, MD;  Location: [facility] ORS;  Service: General;  Laterality: Right;  . BREAST SURGERY Right 05-16-2013   lumpectomy  . SHOULDER ARTHROSCOPY Right 1996  . [place]   . WISDOM TOOTH EXTRACTION      Prior to Admission medications   Medication Sig Start Date End Date Taking? Authorizing Provider  albuterol (VENTOLIN HFA) 108 (90 Base) MCG/ACT inhaler Inhale 2 puffs into the lungs every 4 (four) hours as needed for wheezing or shortness of breath. 12/05/19   Nikia Mangino, Charline Bills, PA-C  amLODipine (NORVASC) 5 MG tablet Take 1 tablet (5 mg total) by mouth daily. 12/05/19 12/04/20  Calle Schader, Charline Bills, PA-C  benzonatate (TESSALON PERLES) 100 MG capsule Take 1 capsule (100 mg total) by mouth every 6 (six) hours as needed. 12/05/19 12/04/20  Nannette Zill, Charline Bills, PA-C  cephALEXin (KEFLEX) 500 MG capsule Take 1 capsule (500 mg total) by mouth 4 (four) times daily. 12/05/19   Calina Patrie, Charline Bills, PA-C  gabapentin (NEURONTIN) 300 MG capsule Take 1 capsule (300 mg total) by mouth 3 (three) times daily. 06/29/18 06/29/19  Darel Hong, MD  omeprazole (  PRILOSEC) 20 MG capsule Take 20 mg by mouth daily. As needed.    [provider]  ondansetron (ZOFRAN ODT) 4 MG disintegrating tablet Take 1 tablet (4 mg total) by mouth every 8 (eight) hours as needed. 11/13/18   Rudene Re, MD  oxyCODONE-acetaminophen (PERCOCET) 5-325 MG tablet Take 1 tablet by mouth every 4 (four) hours as needed. 10/11/19 10/10/20  Gregor Hams, MD  oxyCODONE-acetaminophen (PERCOCET/ROXICET) 5-325 MG tablet Take  1 tablet by mouth every 6 (six) hours as needed for severe pain. 12/05/19   Keyri Salberg, Charline Bills, PA-C  predniSONE (DELTASONE) 50 MG tablet Take 1 tablet (50 mg total) by mouth daily with breakfast. 12/05/19   Hilding Quintanar, Charline Bills, PA-C    Allergies Hydrocodone, Morphine and related, and Motrin [ibuprofen]  Family History  Problem Relation Age of Onset  . Breast cancer Mother   . Cancer Paternal Aunt 46       small bowel  . Breast cancer Maternal Aunt 53  . Breast cancer Sister        younger than age 9  . Breast cancer Sister        younger than age 39  . Breast cancer Maternal Aunt 49  . Breast cancer Cousin 62  . Prostate cancer Brother 85  . Diabetes Paternal Grandmother   . Ovarian cancer Neg Hx     Social History Social History   Tobacco Use  . Smoking status: Current Every Day Smoker    Packs/day: 0.25    Years: 20.00    Pack years: 5.00    Types: Cigarettes  . Smokeless tobacco: Never Used  Substance Use Topics  . Alcohol use: Yes    Alcohol/week: 0.0 - 1.0 standard drinks    Comment: "occassional alcohol use"  . Drug use: No     Review of Systems  Constitutional: No fever/chills Eyes: No visual changes. No discharge ENT: Positive for cough Cardiovascular: no chest pain. Respiratory: Positive cough. No SOB. Gastrointestinal: No abdominal pain.  No nausea, no vomiting.  No diarrhea.  No constipation. Genitourinary: Positive for dysuria. No hematuria Musculoskeletal: Negative for musculoskeletal pain. Skin: Negative for rash, abrasions, lacerations, ecchymosis. Neurological: Negative for headaches, focal weakness or numbness. 10-point ROS otherwise negative.  ____________________________________________   PHYSICAL EXAM:  VITAL SIGNS: ED Triage Vitals [12/05/19 1822]  Enc Vitals Group     BP 120/88     Pulse Rate 93     Resp 18     Temp 98.8 F (37.1 C)     Temp src      SpO2 95 %     Weight 140 lb (63.5 kg)     Height 5' (1.524 m)      Head Circumference      Peak Flow      Pain Score 3     Pain Loc      Pain Edu?      Excl. in [place]?      Constitutional: Alert and oriented. Well appearing and in no acute distress. Eyes: Conjunctivae are normal. PERRL. EOMI. Head: Atraumatic. ENT:      Ears:       Nose: No congestion/rhinnorhea.      Mouth/Throat: Mucous membranes are moist.  Oropharynx is erythematous. Tonsils are mildly edematous with no exudates.  Uvula is midline. Neck: No stridor.  Neck is supple full range of motion Hematological/Lymphatic/Immunilogical: Scattered, mobile, nontender anterior cervical lymphadenopathy. Cardiovascular: Normal rate, regular rhythm. Normal S1 and S2.  Good peripheral circulation.  Respiratory: Normal respiratory effort without tachypnea or retractions. Lungs with a few faint expiratory wheezes in the lower lung fields bilaterally.  No inspiratory wheezing.  No rales or rhonchi.Kermit Balo air entry to the bases with no decreased or absent breath sounds. Gastrointestinal: Bowel sounds 4 quadrants. Soft and nontender to palpation. No guarding or rigidity. No palpable masses. No distention. No CVA tenderness. Musculoskeletal: Full range of motion to all extremities. No gross deformities appreciated. Neurologic:  Normal speech and language. No gross focal neurologic deficits are appreciated.  Skin:  Skin is warm, dry and intact. No rash noted. Psychiatric: Mood and affect are normal. Speech and behavior are normal. Patient exhibits appropriate insight and judgement.   ____________________________________________   LABS (all labs ordered are listed, but only abnormal results are displayed)  Labs Reviewed  URINALYSIS, COMPLETE (UACMP) WITH MICROSCOPIC - Abnormal; Notable for the following components:      Result Value   Color, Urine YELLOW (*)    APPearance HAZY (*)    Glucose, UA 50 (*)    Leukocytes,Ua SMALL (*)    Bacteria, UA FEW (*)    All other components within normal limits   GROUP A STREP BY PCR  URINE CULTURE   ____________________________________________  EKG   ____________________________________________  RADIOLOGY I personally viewed and evaluated these images as part of my medical decision making, as well as reviewing the written report by the radiologist.  DG Chest Portable 1 View  Result Date: 12/05/2019 CLINICAL DATA:  Cough EXAM: PORTABLE CHEST 1 VIEW COMPARISON:  11/13/2018 FINDINGS: There is streaky bibasilar airspace opacities. There is no pneumothorax or large pleural effusion. The heart size is stable from prior studies. Aortic calcifications are noted. There is no acute osseous abnormality. IMPRESSION: Bronchitic changes at the lung bases. Electronically Signed   By: Constance Holster M.D.   On: 12/05/2019 19:31    ____________________________________________    PROCEDURES  Procedure(s) performed:    Procedures    Medications  predniSONE (DELTASONE) tablet 60 mg (has no administration in time range)  cephALEXin (KEFLEX) capsule 500 mg (has no administration in time range)  oxyCODONE-acetaminophen (PERCOCET/ROXICET) 5-325 MG per tablet 1 tablet (has no administration in time range)  amLODipine (NORVASC) tablet 5 mg (has no administration in time range)     ____________________________________________   INITIAL IMPRESSION / ASSESSMENT AND PLAN / ED COURSE  Pertinent labs & imaging results that were available during my care of the patient were reviewed by me and considered in my medical decision making (see chart for details).  Review of the [place] CSRS was performed in accordance of the [place] prior to dispensing any controlled drugs.  Clinical Course as of Dec 04 2122  Wed Dec 05, 2019  1954 Patient presented to emergency department sore throat, cough, dysuria.  I suspect based off the patient's presentation that she has a viral illness causing viral pharyngitis, and bronchitis.  X-ray reveals bronchitis.  I will test the  patient for strep however.  Patient will also have urinalysis to evaluate for possible UTI.  Patient had a UTI 2 months ago that resolved with antibiotics.  She now has similar symptoms.  No fevers or chills.  No evidence for further work-up beyond what is ordered.   [JC]    Clinical Course User Index [JC] Glynn Freas, Charline Bills, PA-C          Patient's diagnosis is consistent with bronchitis, UTI.  Patient presented to the emergency department with multiple complaints.  Findings were  consistent with bronchitis on both physical exam as well as x-ray.  Given complaints of sore throat patient was tested for strep which was negative.  Patient did have some dysuria but no flank pain.  No abdominal pain was reported either.  Patient has leukocytes, few bacteria in her urine, but given return of symptoms following her UTI I will treat the patient for urinary tract infection.  Culture has been sent.  In addition will provide steroid for patient's bronchitis.  Follow-up primary care as needed.. Patient is given ED precautions to return to the ED for any worsening or new symptoms.     ____________________________________________  FINAL CLINICAL IMPRESSION(S) / ED DIAGNOSES  Final diagnoses:  Bronchitis  Viral pharyngitis  Acute cystitis without hematuria      NEW MEDICATIONS STARTED DURING THIS VISIT:  ED Discharge Orders         Ordered    albuterol (VENTOLIN HFA) 108 (90 Base) MCG/ACT inhaler  Every 4 hours PRN     12/05/19 2124    predniSONE (DELTASONE) 50 MG tablet  Daily with breakfast     12/05/19 2124    benzonatate (TESSALON PERLES) 100 MG capsule  Every 6 hours PRN     12/05/19 2124    amLODipine (NORVASC) 5 MG tablet  Daily     12/05/19 2124    cephALEXin (KEFLEX) 500 MG capsule  4 times daily     12/05/19 2124    oxyCODONE-acetaminophen (PERCOCET/ROXICET) 5-325 MG tablet  Every 6 hours PRN     12/05/19 2124              This chart was dictated using voice  recognition software/Dragon. Despite best efforts to proofread, errors can occur which can change the meaning. Any change was purely unintentional.    Darletta Moll, PA-C 12/05/19 2124    Earleen Newport, MD 12/05/19 [phone]

## 2019-12-06 LAB — URINE CULTURE
Culture: <10000 — AB
Special Requests: NORMAL

## 2021-06-25 ENCOUNTER — Emergency Department: Payer: Medicare Other

## 2021-06-25 ENCOUNTER — Encounter: Payer: Self-pay | Admitting: Emergency Medicine

## 2021-06-25 ENCOUNTER — Other Ambulatory Visit: Payer: Self-pay

## 2021-06-25 ENCOUNTER — Emergency Department
Admission: EM | Admit: 2021-06-25 | Discharge: 2021-06-25 | Disposition: A | Discharge status: Home / Self Care | Payer: Medicare Other | Attending: Emergency Medicine | Admitting: Emergency Medicine

## 2021-06-25 DIAGNOSIS — I10 Essential (primary) hypertension: Secondary | ICD-10-CM | POA: Insufficient documentation

## 2021-06-25 DIAGNOSIS — Z79899 Other long term (current) drug therapy: Secondary | ICD-10-CM | POA: Insufficient documentation

## 2021-06-25 DIAGNOSIS — F17210 Nicotine dependence, cigarettes, uncomplicated: Secondary | ICD-10-CM | POA: Diagnosis not present

## 2021-06-25 DIAGNOSIS — Z20822 Contact with and (suspected) exposure to covid-19: Secondary | ICD-10-CM | POA: Diagnosis not present

## 2021-06-25 DIAGNOSIS — Z853 Personal history of malignant neoplasm of breast: Secondary | ICD-10-CM | POA: Diagnosis not present

## 2021-06-25 DIAGNOSIS — R109 Unspecified abdominal pain: Secondary | ICD-10-CM | POA: Insufficient documentation

## 2021-06-25 DIAGNOSIS — J40 Bronchitis, not specified as acute or chronic: Secondary | ICD-10-CM

## 2021-06-25 LAB — URINALYSIS, COMPLETE (UACMP) WITH MICROSCOPIC
Bilirubin Urine: NEGATIVE
Glucose, UA: NEGATIVE mg/dL
Ketones, ur: NEGATIVE mg/dL
Leukocytes,Ua: NEGATIVE
Nitrite: NEGATIVE
Protein, ur: NEGATIVE mg/dL
Specific Gravity, Urine: 1.02 (ref 1.005–1.030)
pH: 7 (ref 5.0–8.0)

## 2021-06-25 LAB — BASIC METABOLIC PANEL
Anion gap: 7 (ref 5–15)
BUN: 17 mg/dL (ref 8–23)
CO2: 27 mmol/L (ref 22–32)
Calcium: 9.3 mg/dL (ref 8.9–10.3)
Chloride: 103 mmol/L (ref 98–111)
Creatinine, Ser: 0.84 mg/dL (ref 0.44–1.00)
GFR, Estimated: >60 mL/min (ref >60)
Glucose, Bld: 119 mg/dL — ABNORMAL HIGH (ref 70–99)
Potassium: 4 mmol/L (ref 3.5–5.1)
Sodium: 137 mmol/L (ref 135–145)

## 2021-06-25 LAB — CBC
HCT: 42.5 % (ref 36.0–46.0)
Hemoglobin: 15.3 g/dL — ABNORMAL HIGH (ref 12.0–15.0)
MCH: 33.6 pg (ref 26.0–34.0)
MCHC: 36 g/dL (ref 30.0–36.0)
MCV: 93.4 fL (ref 80.0–100.0)
Platelets: 200 10*3/uL (ref 150–400)
RBC: 4.55 MIL/uL (ref 3.87–5.11)
RDW: 12.2 % (ref 11.5–15.5)
WBC: 7.4 10*3/uL (ref 4.0–10.5)
nRBC: 0 % (ref 0.0–0.2)

## 2021-06-25 LAB — RESP PANEL BY RT-PCR (FLU A&B, COVID) ARPGX2
Influenza A by PCR: NEGATIVE
Influenza B by PCR: NEGATIVE
SARS Coronavirus 2 by RT PCR: NEGATIVE

## 2021-06-25 LAB — HEPATIC FUNCTION PANEL
ALT: 91 U/L — ABNORMAL HIGH (ref 0–44)
AST: 51 U/L — ABNORMAL HIGH (ref 15–41)
Albumin: 3.6 g/dL (ref 3.5–5.0)
Alkaline Phosphatase: 62 U/L (ref 38–126)
Bilirubin, Direct: 0.1 mg/dL (ref 0.0–0.2)
Indirect Bilirubin: 0.6 mg/dL (ref 0.3–0.9)
Total Bilirubin: 0.7 mg/dL (ref 0.3–1.2)
Total Protein: 6.8 g/dL (ref 6.5–8.1)

## 2021-06-25 LAB — D-DIMER, QUANTITATIVE: D-Dimer, Quant: 1.56 ug/mL-FEU — ABNORMAL HIGH (ref 0.00–0.50)

## 2021-06-25 LAB — LIPASE, BLOOD: Lipase: 26 U/L (ref 11–51)

## 2021-06-25 MED ORDER — ONDANSETRON HCL 4 MG PO TABS: 4.0000 mg | ORAL_TABLET | Freq: Three times a day (TID) | ORAL | 0 refills | Status: AC | PRN

## 2021-06-25 MED ORDER — IOHEXOL 350 MG/ML SOLN
75.0000 mL | Freq: Once | INTRAVENOUS | Status: AC | PRN
  Administered 2021-06-25: 75 mL via INTRAVENOUS

## 2021-06-25 MED ORDER — HYDROMORPHONE HCL 1 MG/ML IJ SOLN
1.0000 mg | Freq: Once | INTRAMUSCULAR | Status: AC
  Administered 2021-06-25: 1 mg via INTRAVENOUS
  Filled 2021-06-25: qty 1

## 2021-06-25 MED ORDER — ONDANSETRON HCL 4 MG/2ML IJ SOLN
4.0000 mg | Freq: Once | INTRAMUSCULAR | Status: AC
  Administered 2021-06-25: 4 mg via INTRAVENOUS
  Filled 2021-06-25: qty 2

## 2021-06-25 MED ORDER — HYDROCODONE-ACETAMINOPHEN 5-325 MG PO TABS: 2.0000 | ORAL_TABLET | Freq: Four times a day (QID) | ORAL | 0 refills | Status: AC | PRN

## 2021-06-25 NOTE — ED Notes (Signed)
Patient transported to CT 

## 2021-06-25 NOTE — ED Provider Notes (Addendum)
I assumed care of this patient from a provider approximately 0 700.  Please (note for full details regarding patient history evaluation assessment.  In brief patient presents for assessment of some right-sided flank pain as well as nonproductive cough over the last couple days.  She reports he also was febrile 3 days ago.  Initial concern for possible kidney stone.  UA with some trace hemoglobin and rare bacteria but no other evidence of infection.  BMP without significant electrolyte or metabolic derangements.  CBC without leukocytosis or acute anemia hepatic function panel without cholestatic pattern and improving transaminitis at 51 and 91 AST and ALT respectively compared to 116 145 2 years ago.  Lipase not consistent with acute pancreatitis.  COVID influenza PCR is negative.  D-dimer is elevated at 1.56.  Chest x-ray is unremarkable.  The abdomen pelvis shows some right-sided small kidney stones and right-sided hydronephrosis and hydroureter but no obstructing stone concern for possible recent passage of stone.  There is also hepatic and left renal cyst stable [place] atherosclerosis.  Plan is to follow-up CTA chest ordered to rule out PE given elevated D-dimer and if this is negative likely discharge home.  CTA chest is negative.  Patient's SPO2 briefly dropped to the 90s after receiving analgesia she was placed on 2 L which was weaned off to room air.  Following this SPO2 noted to be 93 to 94%.  Very briefly dropped to 90% while ambulating but now is 93 to 96% after 2 or 3 seconds..  Suspect likely bronchitis as well as recently passed kidney stone.  Will write Rx for Zofran.  Recommend close outpatient PCP follow-up to have her blood pressure rechecked and possible titration of blood pressure medicines.  She is amenable to plan.  Discharged stable condition.   Lucrezia Starch, MD 06/25/21 [id number]    Lucrezia Starch, MD 06/25/21 [phone]

## 2021-06-25 NOTE — ED Notes (Signed)
Pt O2 sat 100 on [place] at 2L.  This RN ambulated pt in room w/out [place].  O2 sat 93 on RA while sitting. While ambulating varied 90-93. Pt now in bed w/out [place] sats are steady at 93-94.

## 2021-06-25 NOTE — ED Provider Notes (Signed)
[facility] Emergency Department Provider Note  ____________________________________________   Event Date/Time   First MD Initiated Contact with Patient 06/25/21 [phone]     (approximate)  I have reviewed the triage vital signs and the nursing notes.   HISTORY  Chief Complaint Back Pain    HPI Tricia Wilson is a 65 y.o. female with history of breast cancer, hypertension, kidney stones who presents to the emergency department with complaints of right pinky pain for the past several days.  Worse with movement, coughing.  She states this feels different than her previous kidney stones.  She states about a week ago she started having nonproductive cough and was diagnosed with bronchitis.  She had a fever of 101 3 days ago.  She denies any history of PE, DVT.  No calf tenderness or calf swelling.  No chest pain, shortness of breath.  Has had nausea and vomiting x2 today.  No diarrhea.  No abdominal pain.  No injury to her back that she can recall.  She denies numbness, tingling, weakness, bowel or bladder incontinence.        Past Medical History:  Diagnosis Date   Breast cancer 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   Hypertension    Kidney stone     Patient Active Problem List   Diagnosis Date Noted   Cocaine use 08/09/2018   Chronic low back pain (Primary Area of Pain) (Bilateral) (R>L) w/ sciatica (Right) 08/03/2018   Chronic lower extremity pain (Secondary Area of Pain) (Right) 08/03/2018   Chronic sacroiliac joint pain (Left) 08/03/2018   Chronic pain syndrome 08/03/2018   Pharmacologic therapy 08/03/2018   Disorder of skeletal system 08/03/2018   Problems influencing health status 08/03/2018   Lower extremity weakness (bilateral) 08/03/2018   UTI (lower urinary tract infection) 02/03/2016   Breast mass, right 08/12/2015   DCIS (ductal  carcinoma in situ) of breast 05/10/2013    Past Surgical History:  Procedure Laterality Date   BREAST BIOPSY Right 08/12/2015   US guided biopsy w/ clip placement/SPINDLE CELL PROLIFERATION WITH CYTOLOGIC ATYPIA   BREAST LUMPECTOMY Right 08/29/2015   Procedure: BREAST LUMPECTOMY;  Surgeon: Christene Lye, MD;  Location: [facility] ORS;  Service: General;  Laterality: Right;   BREAST SURGERY Right 05-16-2013   lumpectomy   SHOULDER ARTHROSCOPY Right [place]      Prior to Admission medications   Medication Sig Start Date End Date Taking? Authorizing Provider  albuterol (VENTOLIN HFA) 108 (90 Base) MCG/ACT inhaler Inhale 2 puffs into the lungs every 4 (four) hours as needed for wheezing or shortness of breath. 12/05/19   Cuthriell, Charline Bills, PA-C  amLODipine (NORVASC) 5 MG tablet Take 1 tablet (5 mg total) by mouth daily. 12/05/19 12/04/20  Cuthriell, Charline Bills, PA-C  cephALEXin (KEFLEX) 500 MG capsule Take 1 capsule (500 mg total) by mouth 4 (four) times daily. 12/05/19   Cuthriell, Charline Bills, PA-C  gabapentin (NEURONTIN) 300 MG capsule Take 1 capsule (300 mg total) by mouth 3 (three) times daily. 06/29/18 06/29/19  Darel Hong, MD  omeprazole (PRILOSEC) 20 MG capsule Take 20 mg by mouth daily. As needed.    [provider]  ondansetron (ZOFRAN ODT) 4 MG disintegrating tablet Take 1 tablet (4 mg  total) by mouth every 8 (eight) hours as needed. 11/13/18   Rudene Re, MD  oxyCODONE-acetaminophen (PERCOCET/ROXICET) 5-325 MG tablet Take 1 tablet by mouth every 6 (six) hours as needed for severe pain. 12/05/19   Cuthriell, Charline Bills, PA-C  predniSONE (DELTASONE) 50 MG tablet Take 1 tablet (50 mg total) by mouth daily with breakfast. 12/05/19   Cuthriell, Charline Bills, PA-C    Allergies Hydrocodone, Morphine and related, and Motrin [ibuprofen]  Family History  Problem Relation Age of Onset   Breast cancer Mother     Cancer Paternal Aunt 62       small bowel   Breast cancer Maternal Aunt 8   Breast cancer Sister        younger than age 53   Breast cancer Sister        younger than age 65   Breast cancer Maternal Aunt 57   Breast cancer Cousin 38   Prostate cancer Brother 70   Diabetes Paternal Grandmother    Ovarian cancer Neg Hx     Social History Social History   Tobacco Use   Smoking status: Every Day    Packs/day: 0.25    Years: 20.00    Pack years: 5.00    Types: Cigarettes   Smokeless tobacco: Never  Substance Use Topics   Alcohol use: Yes    Alcohol/week: 0.0 - 1.0 standard drinks    Comment: "occassional alcohol use"   Drug use: No    Review of Systems Constitutional: + fever. Eyes: No visual changes. ENT: No sore throat. Cardiovascular: Denies chest pain. Respiratory: Denies shortness of breath. Gastrointestinal: + nausea, vomiting.  No diarrhea. Genitourinary: Negative for dysuria. Musculoskeletal: + back pain. Skin: Negative for rash. Neurological: Negative for focal weakness or numbness.  ____________________________________________   PHYSICAL EXAM:  VITAL SIGNS: ED Triage Vitals  Enc Vitals Group     BP 06/25/21 0221 (!) 218/126     Pulse Rate 06/25/21 0221 75     Resp 06/25/21 0221 19     Temp 06/25/21 0228 98.6 F (37 C)     Temp Source 06/25/21 0228 Oral     SpO2 06/25/21 0221 99 %     Weight 06/25/21 0235 140 lb (63.5 kg)     Height 06/25/21 0235 5' (1.524 m)     Head Circumference --      Peak Flow --      Pain Score 06/25/21 0234 10     Pain Loc --      Pain Edu? --      Excl. in [place]? --    CONSTITUTIONAL: Alert and oriented and responds appropriately to questions.  Appears uncomfortable but nontoxic, afebrile HEAD: Normocephalic EYES: Conjunctivae clear, pupils appear equal, EOM appear intact ENT: normal nose; moist mucous membranes NECK: Supple, normal ROM CARD: RRR; S1 and S2 appreciated; no murmurs, no clicks, no rubs, no  gallops RESP: Normal chest excursion without splinting or tachypnea; breath sounds clear and equal bilaterally; no wheezes, no rhonchi, no rales, no hypoxia or respiratory distress, speaking full sentences ABD/GI: Normal bowel sounds; non-distended; soft, non-tender, no rebound, no guarding, no peritoneal signs, no hepatosplenomegaly BACK: The back appears normal, tender to palpation over the right flank and right lumbar paraspinal muscles without redness, warmth, soft tissue swelling, ecchymosis, rash or other lesions EXT: Normal ROM in all joints; no deformity noted, no edema; no cyanosis SKIN: Normal color for age and race; warm; no rash on exposed skin NEURO: Moves all extremities  equally, normal sensation diffusely, normal gait PSYCH: The patient's mood and manner are appropriate.  ____________________________________________   LABS (all labs ordered are listed, but only abnormal results are displayed)  Labs Reviewed  URINALYSIS, COMPLETE (UACMP) WITH MICROSCOPIC - Abnormal; Notable for the following components:      Result Value   Hgb urine dipstick TRACE (*)    Bacteria, UA RARE (*)    All other components within normal limits  BASIC METABOLIC PANEL - Abnormal; Notable for the following components:   Glucose, Bld 119 (*)    All other components within normal limits  CBC - Abnormal; Notable for the following components:   Hemoglobin 15.3 (*)    All other components within normal limits  D-DIMER, QUANTITATIVE - Abnormal; Notable for the following components:   D-Dimer, Quant 1.56 (*)    All other components within normal limits  HEPATIC FUNCTION PANEL - Abnormal; Notable for the following components:   AST 51 (*)    ALT 91 (*)    All other components within normal limits  RESP PANEL BY RT-PCR (FLU A&B, COVID) ARPGX2  LIPASE, BLOOD   ____________________________________________  EKG   EKG Interpretation  Date/Time:  Thursday June 25 2021 06:48:07 EDT Ventricular  Rate:  75 PR Interval:  147 QRS Duration: 80 QT Interval:  421 QTC Calculation: 471 R Axis:   35 Text Interpretation: Sinus rhythm Borderline T wave abnormalities Confirmed by UNCONFIRMED, DOCTOR ([id number]), editor Mel Almond, Tammy [phone]) on 06/25/2021 7:19:46 AM        ____________________________________________  RADIOLOGY Jessie Foot Wanya Bangura, personally viewed and evaluated these images (plain radiographs) as part of my medical decision making, as well as reviewing the written report by the radiologist.  ED MD interpretation: Chest x-ray clear.  CT scan shows right-sided hydronephrosis but no ureterolithiasis.  Official radiology report(s): DG Chest 2 View  Result Date: 06/25/2021 CLINICAL DATA:  Cough and fever. EXAM: CHEST - 2 VIEW COMPARISON:  12/05/2019 FINDINGS: The cardiac silhouette, mediastinal and hilar contours are is stable. Mild tortuosity of the thoracic aorta. The lungs are clear. No pleural effusions. No pulmonary lesions. The bony thorax is intact. IMPRESSION: No acute cardiopulmonary findings. Electronically Signed   By: Marijo Sanes M.D.   On: 06/25/2021 06:19   CT Renal Stone Study  Result Date: 06/25/2021 CLINICAL DATA:  Right flank pain with nausea and vomiting for 2 days. EXAM: CT ABDOMEN AND PELVIS WITHOUT CONTRAST TECHNIQUE: Multidetector CT imaging of the abdomen and pelvis was performed following the standard protocol without IV contrast. COMPARISON:  10/10/2019 FINDINGS: Lower chest: The lung bases are clear of acute process. No pleural effusion or pulmonary lesions. The heart is normal in size. No pericardial effusion. The distal esophagus and aorta are unremarkable. Hepatobiliary: Stable left hepatic lobe cysts. No worrisome hepatic lesions are identified without contrast. No intrahepatic biliary dilatation. The gallbladder is unremarkable. No common bile duct dilatation. Pancreas: No mass, inflammation or ductal dilatation. Spleen: Normal size. No focal lesions.  Adrenals/Urinary Tract: Adrenal glands are normal. Numerous small right renal calculi are noted. Mild right sided hydronephrosis and right hydroureter but no obstructing ureteral calculus is identified. The distal ureter is normal in caliber/decompressed. Findings could be due to a recent stone passage. No left-sided renal or ureteral calculi. No bladder calculi or bladder mass. No worrisome renal lesions are identified without contrast. Stable small upper pole left renal cyst. Stomach/Bowel: The stomach duodenum, small and colon are grossly normal without oral contrast. No acute inflammatory changes, mass  lesions or obstructive findings. The terminal ileum and appendix are normal. Scattered colonic diverticulosis. Vascular/Lymphatic: Age advanced atherosclerotic calcifications involving the aorta and iliac arteries but no aneurysm. No mesenteric or retroperitoneal mass or adenopathy. Reproductive: The uterus and ovaries are unremarkable. Other: No pelvic mass or adenopathy. No free pelvic fluid collections. No inguinal mass or adenopathy. No abdominal wall hernia or subcutaneous lesions. Musculoskeletal: No significant bony findings. Advanced degenerative disc disease noted at L5-S1. IMPRESSION: 1. Numerous small right renal calculi. 2. Right-sided hydronephrosis and right hydroureter but no obstructing ureteral calculus. Findings could be due to recent stone passage. 3. No bladder calculi or bladder mass. 4. Stable left hepatic lobe and left renal cysts. 5. Age advanced atherosclerotic calcifications involving the aorta and iliac arteries. 6. Aortic atherosclerosis. Aortic Atherosclerosis (ICD10-I70.0). Electronically Signed   By: Marijo Sanes M.D.   On: 06/25/2021 06:32    ____________________________________________   PROCEDURES  Procedure(s) performed (including Critical Care):  Procedures   ____________________________________________   INITIAL IMPRESSION / ASSESSMENT AND PLAN / ED COURSE  As  part of my medical decision making, I reviewed the following data within the [place] notes reviewed and incorporated, Labs reviewed , EKG interpreted , Old EKG reviewed, Patient signed out to oncoming ED physician, Radiograph reviewed , CT reviewed, and Notes from prior ED visits         Patient here with complaints of right flank pain.  Differential is quite large including kidney stone, pyelonephritis, UTI, musculoskeletal pain.  This also could be pneumonia, PE given recent infectious symptoms.  Labs, urine obtained in triage are unremarkable.  Biliary colic, pancreatitis also on the differential however abdominal exam is benign.  She is also extremely hypertensive here which may be due to pain.  She denies headache, vision changes, numbness, tingling, weakness, chest pain or shortness of breath.  Will recheck after pain medication.  Given recent cough, fevers, will obtain COVID-19 test today.  ED PROGRESS  Patient's chest x-ray is clear.  CT scan shows right-sided hydronephrosis and hydroureter with no signs of obstruction.  This could be due to her recent stone passage.  No other acute abnormality noted.   7:15 AM  Pt's D-dimer is elevated.  We will proceed with CTA of the chest to rule out PE.  She has not been hypoxic here and has no increased work of breathing.  Signed out to oncoming ED physician.  I suspect now that most of her pain is likely due to her recent passed kidney stone.   I reviewed all nursing notes and pertinent previous records as available.  I have reviewed and interpreted any EKGs, lab and urine results, imaging (as available).  ____________________________________________   FINAL CLINICAL IMPRESSION(S) / ED DIAGNOSES  Final diagnoses:  Right flank pain  Hypertension, unspecified type     ED Discharge Orders     None       *Please note:  Tricia Wilson was evaluated in Emergency Department on 06/25/2021 for the symptoms described  in the history of present illness. She was evaluated in the context of the global COVID-19 pandemic, which necessitated consideration that the patient might be at risk for infection with the SARS-CoV-2 virus that causes COVID-19. Institutional protocols and algorithms that pertain to the evaluation of patients at risk for COVID-19 are in a state of rapid change based on information released by regulatory bodies including the CDC and federal and state organizations. These policies and algorithms were followed during the  patient's care in the ED.  Some ED evaluations and interventions may be delayed as a result of limited staffing during and the pandemic.*   Note:  This document was prepared using Dragon voice recognition software and may include unintentional dictation errors.    Aleda Madl, Delice Bison, DO 06/25/21 [phone]

## 2021-06-25 NOTE — ED Triage Notes (Signed)
Pt to ED via EMS from home c/o right back pain and side pain for a couple days getting worse.  Denies urinary changes, n/v x2 today, states hx of kidney stones.  Pt A&Ox4, chest rise even and unlabored, in NAD at this time.

## 2021-06-30 ENCOUNTER — Other Ambulatory Visit: Payer: Self-pay

## 2021-06-30 ENCOUNTER — Emergency Department: Payer: Medicare Other

## 2021-06-30 ENCOUNTER — Emergency Department
Admission: EM | Admit: 2021-06-30 | Discharge: 2021-06-30 | Disposition: A | Discharge status: Home / Self Care | Payer: Medicare Other | Attending: Emergency Medicine | Admitting: Emergency Medicine

## 2021-06-30 DIAGNOSIS — Z853 Personal history of malignant neoplasm of breast: Secondary | ICD-10-CM | POA: Insufficient documentation

## 2021-06-30 DIAGNOSIS — Z79899 Other long term (current) drug therapy: Secondary | ICD-10-CM | POA: Insufficient documentation

## 2021-06-30 DIAGNOSIS — I10 Essential (primary) hypertension: Secondary | ICD-10-CM | POA: Diagnosis not present

## 2021-06-30 DIAGNOSIS — F17210 Nicotine dependence, cigarettes, uncomplicated: Secondary | ICD-10-CM | POA: Insufficient documentation

## 2021-06-30 DIAGNOSIS — R1011 Right upper quadrant pain: Secondary | ICD-10-CM | POA: Insufficient documentation

## 2021-06-30 DIAGNOSIS — R109 Unspecified abdominal pain: Secondary | ICD-10-CM

## 2021-06-30 LAB — BASIC METABOLIC PANEL
Anion gap: 8 (ref 5–15)
BUN: 11 mg/dL (ref 8–23)
CO2: 26 mmol/L (ref 22–32)
Calcium: 9.7 mg/dL (ref 8.9–10.3)
Chloride: 103 mmol/L (ref 98–111)
Creatinine, Ser: 0.9 mg/dL (ref 0.44–1.00)
GFR, Estimated: >60 mL/min (ref >60)
Glucose, Bld: 111 mg/dL — ABNORMAL HIGH (ref 70–99)
Potassium: 4.8 mmol/L (ref 3.5–5.1)
Sodium: 137 mmol/L (ref 135–145)

## 2021-06-30 LAB — CBC
HCT: 45.9 % (ref 36.0–46.0)
Hemoglobin: 16 g/dL — ABNORMAL HIGH (ref 12.0–15.0)
MCH: 33.3 pg (ref 26.0–34.0)
MCHC: 34.9 g/dL (ref 30.0–36.0)
MCV: 95.4 fL (ref 80.0–100.0)
Platelets: 206 10*3/uL (ref 150–400)
RBC: 4.81 MIL/uL (ref 3.87–5.11)
RDW: 12.4 % (ref 11.5–15.5)
WBC: 6.4 10*3/uL (ref 4.0–10.5)
nRBC: 0 % (ref 0.0–0.2)

## 2021-06-30 LAB — URINALYSIS, COMPLETE (UACMP) WITH MICROSCOPIC
Bilirubin Urine: NEGATIVE
Glucose, UA: NEGATIVE mg/dL
Hgb urine dipstick: NEGATIVE
Ketones, ur: NEGATIVE mg/dL
Leukocytes,Ua: NEGATIVE
Nitrite: NEGATIVE
Protein, ur: NEGATIVE mg/dL
Specific Gravity, Urine: 1.008 (ref 1.005–1.030)
pH: 6 (ref 5.0–8.0)

## 2021-06-30 LAB — HEPATIC FUNCTION PANEL
ALT: 86 U/L — ABNORMAL HIGH (ref 0–44)
AST: 49 U/L — ABNORMAL HIGH (ref 15–41)
Albumin: 4.3 g/dL (ref 3.5–5.0)
Alkaline Phosphatase: 68 U/L (ref 38–126)
Bilirubin, Direct: <0.1 mg/dL (ref 0.0–0.2)
Total Bilirubin: 0.7 mg/dL (ref 0.3–1.2)
Total Protein: 7.6 g/dL (ref 6.5–8.1)

## 2021-06-30 LAB — LIPASE, BLOOD: Lipase: 27 U/L (ref 11–51)

## 2021-06-30 MED ORDER — HYDROCODONE-ACETAMINOPHEN 5-325 MG PO TABS: 1.0000 | ORAL_TABLET | ORAL | 0 refills | Status: DC | PRN

## 2021-06-30 MED ORDER — HYDROCODONE-ACETAMINOPHEN 5-325 MG PO TABS: 1.0000 | ORAL_TABLET | ORAL | 0 refills | Status: AC | PRN

## 2021-06-30 NOTE — ED Triage Notes (Signed)
Pt to ED via POV with complaints of right flank pain, she was found to have a kidney stone on 9/8, she is still having pain, she tried to go to the urologist and they could not fit her in, she went to urgent care and they sent her back here to be evaluated.

## 2021-06-30 NOTE — ED Provider Notes (Signed)
[facility] Emergency Department Provider Note  Time seen: 7:59 PM  I have reviewed the triage vital signs and the nursing notes.   HISTORY  Chief Complaint Flank Pain   HPI Tricia Wilson is a 65 y.o. female with a past medical history of right breast cancer status postradiation, hypertension, presents emergency department for right flank pain.  According to the patient for the past week or so she has been experiencing pain in her right flank.  Patient was seen in the emergency department approximately 4 days ago for the same had a fairly extensive work-up performed including CT a scan of the chest as well as CT scan of the abdomen was diagnosed with a possible kidney stone although no kidney stone clearly visualized on CT.  Patient states he continues to have pain although states the pain has moved in location is now mostly in the right upper quadrant, worse with movement.  Denies any vomiting or diarrhea.  Denies any fever or dysuria or hematuria.   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   Hypertension    Kidney stone     Patient Active Problem List   Diagnosis Date Noted   Cocaine use 08/09/2018   Chronic low back pain (Primary Area of Pain) (Bilateral) (R>L) w/ sciatica (Right) 08/03/2018   Chronic lower extremity pain (Secondary Area of Pain) (Right) 08/03/2018   Chronic sacroiliac joint pain (Left) 08/03/2018   Chronic pain syndrome 08/03/2018   Pharmacologic therapy 08/03/2018   Disorder of skeletal system 08/03/2018   Problems influencing health status 08/03/2018   Lower extremity weakness (bilateral) 08/03/2018   UTI (lower urinary tract infection) 02/03/2016   Breast mass, right 08/12/2015   DCIS (ductal carcinoma in situ) of breast 05/10/2013    Past Surgical History:   Procedure Laterality Date   BREAST BIOPSY Right 08/12/2015   US guided biopsy w/ clip placement/SPINDLE CELL PROLIFERATION WITH CYTOLOGIC ATYPIA   BREAST LUMPECTOMY Right 08/29/2015   Procedure: BREAST LUMPECTOMY;  Surgeon: Christene Lye, MD;  Location: [facility] ORS;  Service: General;  Laterality: Right;   BREAST SURGERY Right 05-16-2013   lumpectomy   SHOULDER ARTHROSCOPY Right [place]      Prior to Admission medications   Medication Sig Start Date End Date Taking? Authorizing Provider  albuterol (VENTOLIN HFA) 108 (90 Base) MCG/ACT inhaler Inhale 2 puffs into the lungs every 4 (four) hours as needed for wheezing or shortness of breath. 12/05/19   Cuthriell, Charline Bills, PA-C  amLODipine (NORVASC) 5 MG tablet Take 1 tablet (5 mg total) by mouth daily. 12/05/19 12/04/20  Cuthriell, Charline Bills, PA-C  gabapentin (NEURONTIN) 300 MG capsule Take 1 capsule (300 mg total) by mouth 3 (three) times daily. 06/29/18 06/29/19  Darel Hong, MD  omeprazole (PRILOSEC) 20 MG capsule Take 20 mg by mouth daily. As needed.    [provider]  ondansetron (ZOFRAN) 4 MG tablet Take 1 tablet (4 mg total) by mouth every 8 (eight) hours as needed for up to 10 doses for nausea or vomiting. 06/25/21   Lucrezia Starch, MD  predniSONE (DELTASONE) 50 MG tablet Take 1 tablet (50 mg total) by mouth daily with breakfast. 12/05/19  Cuthriell, Charline Bills, PA-C    Allergies  Allergen Reactions   Hydrocodone    Morphine And Related    Motrin [Ibuprofen]     Family History  Problem Relation Age of Onset   Breast cancer Mother    Cancer Paternal Aunt 17       small bowel   Breast cancer Maternal Aunt 38   Breast cancer Sister        younger than age 50   Breast cancer Sister        younger than age 100   Breast cancer Maternal Aunt 8   Breast cancer Cousin 38   Prostate cancer Brother 49   Diabetes Paternal Grandmother    Ovarian cancer  Neg Hx     Social History Social History   Tobacco Use   Smoking status: Every Day    Packs/day: 0.25    Years: 20.00    Pack years: 5.00    Types: Cigarettes   Smokeless tobacco: Never  Substance Use Topics   Alcohol use: Yes    Alcohol/week: 0.0 - 1.0 standard drinks    Comment: "occassional alcohol use"   Drug use: No    Review of Systems Constitutional: Negative for fever. Cardiovascular: Negative for chest pain. Respiratory: Negative for shortness of breath. Gastrointestinal: Right upper quadrant abdominal pain. Genitourinary: Negative for urinary compaints Musculoskeletal: Negative for musculoskeletal complaints Neurological: Negative for headache All other ROS negative  ____________________________________________   PHYSICAL EXAM:  VITAL SIGNS: ED Triage Vitals [06/30/21 1807]  Enc Vitals Group     BP (!) 206/118     Pulse Rate 63     Resp 18     Temp 98.3 F (36.8 C)     Temp Source Oral     SpO2 98 %     Weight 149 lb (67.6 kg)     Height 5' (1.524 m)     Head Circumference      Peak Flow      Pain Score 7     Pain Loc      Pain Edu?      Excl. in [place]?    Constitutional: Alert and oriented. Well appearing and in no distress. Eyes: Normal exam ENT      Head: Normocephalic and atraumatic.      Nose: No congestion/rhinnorhea.      Mouth/Throat: Mucous membranes are moist. Cardiovascular: Normal rate, regular rhythm. No murmurs, rubs, or gallops. Respiratory: Normal respiratory effort without tachypnea nor retractions. Breath sounds are clear and equal bilaterally. No wheezes/rales/rhonchi. Gastrointestinal: Soft, mild to moderate right upper quadrant tenderness otherwise benign abdomen without rebound guarding or distention.  No CVA tenderness. Musculoskeletal: Nontender with normal range of motion in all extremities.  Neurologic:  Normal speech and language. No gross focal neurologic deficits  Skin:  Skin is warm, dry and intact.  Psychiatric:  Mood and affect are normal.   ____________________________________________    RADIOLOGY  Ultrasound shows hepatic cyst.  No other abnormalities noted.  ____________________________________________   INITIAL IMPRESSION / ASSESSMENT AND PLAN / ED COURSE  Pertinent labs & imaging results that were available during my care of the patient were reviewed by me and considered in my medical decision making (see chart for details).   Patient presents emergency department for right upper quadrant abdominal tenderness x1 week.  Patient had a fairly extensive work-up during her last evaluation which I reviewed including a negative CTA scan of the chest as well as a negative Noncon  CT of the abdomen besides some mild right-sided Hydro ureter nephrosis but no obvious stone, possibly recently passed stone.  Patient's discomfort appears to be mostly in the right upper quadrant.  We will add on LFTs as her LFTs last time appear to be slightly elevated.  We will obtain a right upper quadrant ultrasound and continue to closely monitor.  Overall the patient appears very well.  Reassuring physical exam.  No acute distress.  Ultrasound shows hepatic cyst otherwise normal.  Patient's LFTs are mildly elevated again but consistent with past/historical values.  Overall patient appears well.  We will discharge with PCP follow-up.  Patient agreeable to plan of care.  Discussed return precautions.  Tricia Wilson was evaluated in Emergency Department on 06/30/2021 for the symptoms described in the history of present illness. She was evaluated in the context of the global COVID-19 pandemic, which necessitated consideration that the patient might be at risk for infection with the SARS-CoV-2 virus that causes COVID-19. Institutional protocols and algorithms that pertain to the evaluation of patients at risk for COVID-19 are in a state of rapid change based on information released by regulatory bodies including the CDC and federal  and state organizations. These policies and algorithms were followed during the patient's care in the ED.  ____________________________________________   FINAL CLINICAL IMPRESSION(S) / ED DIAGNOSES  Right upper quadrant abdominal pain   Harvest Dark, MD 06/30/21 2042

## 2021-07-07 ENCOUNTER — Other Ambulatory Visit: Payer: Self-pay | Admitting: Student

## 2021-07-07 DIAGNOSIS — Z1231 Encounter for screening mammogram for malignant neoplasm of breast: Secondary | ICD-10-CM

## 2021-07-24 ENCOUNTER — Other Ambulatory Visit: Payer: Self-pay | Admitting: Student

## 2021-07-24 DIAGNOSIS — Z13820 Encounter for screening for osteoporosis: Secondary | ICD-10-CM

## 2022-01-13 ENCOUNTER — Other Ambulatory Visit: Payer: Self-pay

## 2022-01-13 ENCOUNTER — Emergency Department
Admission: EM | Admit: 2022-01-13 | Discharge: 2022-01-13 | Disposition: A | Discharge status: Home / Self Care | Payer: Medicare HMO | Attending: Emergency Medicine | Admitting: Emergency Medicine

## 2022-01-13 DIAGNOSIS — N390 Urinary tract infection, site not specified: Secondary | ICD-10-CM | POA: Insufficient documentation

## 2022-01-13 DIAGNOSIS — E1165 Type 2 diabetes mellitus with hyperglycemia: Secondary | ICD-10-CM | POA: Diagnosis not present

## 2022-01-13 DIAGNOSIS — R300 Dysuria: Secondary | ICD-10-CM | POA: Diagnosis present

## 2022-01-13 DIAGNOSIS — R739 Hyperglycemia, unspecified: Secondary | ICD-10-CM

## 2022-01-13 LAB — CBC
HCT: 45.7 % (ref 36.0–46.0)
Hemoglobin: 15.5 g/dL — ABNORMAL HIGH (ref 12.0–15.0)
MCH: 31.4 pg (ref 26.0–34.0)
MCHC: 33.9 g/dL (ref 30.0–36.0)
MCV: 92.7 fL (ref 80.0–100.0)
Platelets: 254 10*3/uL (ref 150–400)
RBC: 4.93 MIL/uL (ref 3.87–5.11)
RDW: 12.5 % (ref 11.5–15.5)
WBC: 8.3 10*3/uL (ref 4.0–10.5)
nRBC: 0 % (ref 0.0–0.2)

## 2022-01-13 LAB — BASIC METABOLIC PANEL
Anion gap: 9 (ref 5–15)
BUN: 19 mg/dL (ref 8–23)
CO2: 25 mmol/L (ref 22–32)
Calcium: 9.8 mg/dL (ref 8.9–10.3)
Chloride: 101 mmol/L (ref 98–111)
Creatinine, Ser: 0.91 mg/dL (ref 0.44–1.00)
GFR, Estimated: >60 mL/min (ref >60)
Glucose, Bld: 176 mg/dL — ABNORMAL HIGH (ref 70–99)
Potassium: 3.4 mmol/L — ABNORMAL LOW (ref 3.5–5.1)
Sodium: 135 mmol/L (ref 135–145)

## 2022-01-13 LAB — URINALYSIS, ROUTINE W REFLEX MICROSCOPIC
Bilirubin Urine: NEGATIVE
Glucose, UA: 150 mg/dL — AB
Ketones, ur: NEGATIVE mg/dL
Nitrite: POSITIVE — AB
Protein, ur: 30 mg/dL — AB
Specific Gravity, Urine: 1.014 (ref 1.005–1.030)
WBC, UA: >50 WBC/hpf — ABNORMAL HIGH (ref 0–5)
pH: 5 (ref 5.0–8.0)

## 2022-01-13 LAB — CBG MONITORING, ED: Glucose-Capillary: 183 mg/dL — ABNORMAL HIGH (ref 70–99)

## 2022-01-13 MED ORDER — CEPHALEXIN 500 MG PO CAPS: 500.0000 mg | ORAL_CAPSULE | Freq: Four times a day (QID) | ORAL | 0 refills | Status: AC

## 2022-01-13 NOTE — ED Provider Notes (Signed)
? ?[facility] ?Provider Note ? ?Patient Contact: 7:16 PM (approximate) ? ? ?History  ? ?Hyperglycemia ? ? ?HPI ? ?Tricia Wilson is a 66 y.o. female who presents to the emergency department complaining of hyperglycemia.  Patient states that her blood sugar read 555 on her home meter today.  She presented for evaluation.  She did endorse some dysuria and polyuria beginning yesterday but denied any other symptoms such as polydipsia, weakness, altered mental status.  She endorses eating a bowl of ice cream yesterday which she knows she is not supposed to do but states that most of the time she is taking care of her diabetes.  Patient presented after seeing the elevated reading on her home glucometer at home. ?  ? ? ?Physical Exam  ? ?Triage Vital Signs: ?ED Triage Vitals  ?Enc Vitals Group  ?   BP 01/13/22 1631 (!) 151/98  ?   Pulse Rate 01/13/22 1631 89  ?   Resp 01/13/22 1631 18  ?   Temp 01/13/22 1631 98 ?F (36.7 ?C)  ?   Temp src --   ?   SpO2 01/13/22 1631 92 %  ?   Weight --   ?   Height --   ?   Head Circumference --   ?   Peak Flow --   ?   Pain Score 01/13/22 1636 0  ?   Pain Loc --   ?   Pain Edu? --   ?   Excl. in [place]? --   ? ? ?Most recent vital signs: ?Vitals:  ? 01/13/22 1631  ?BP: (!) 151/98  ?Pulse: 89  ?Resp: 18  ?Temp: 98 ?F (36.7 ?C)  ?SpO2: 92%  ? ? ? ?General: Alert and in no acute distress.   ?Cardiovascular:  Good peripheral perfusion ?Respiratory: Normal respiratory effort without tachypnea or retractions. Lungs CTAB. Good air entry to the bases with no decreased or absent breath sounds. ?Gastrointestinal: Bowel sounds ?4 quadrants. Soft and nontender to palpation. No guarding or rigidity. No palpable masses. No distention. No CVA tenderness. ?Musculoskeletal: Full range of motion to all extremities.  ?Neurologic:  No gross focal neurologic deficits are appreciated.  ?Skin:   No rash noted ?Other: ? ? ?ED Results / Procedures / Treatments  ? ?Labs ?(all labs ordered are  listed, but only abnormal results are displayed) ?Labs Reviewed  ?BASIC METABOLIC PANEL - Abnormal; Notable for the following components:  ?    Result Value  ? Potassium 3.4 (*)   ? Glucose, Bld 176 (*)   ? All other components within normal limits  ?CBC - Abnormal; Notable for the following components:  ? Hemoglobin 15.5 (*)   ? All other components within normal limits  ?URINALYSIS, ROUTINE W REFLEX MICROSCOPIC - Abnormal; Notable for the following components:  ? Color, Urine YELLOW (*)   ? APPearance CLOUDY (*)   ? Glucose, UA 150 (*)   ? Hgb urine dipstick SMALL (*)   ? Protein, ur 30 (*)   ? Nitrite POSITIVE (*)   ? Leukocytes,Ua LARGE (*)   ? WBC, UA >50 (*)   ? Bacteria, UA MANY (*)   ? All other components within normal limits  ?CBG MONITORING, ED - Abnormal; Notable for the following components:  ? Glucose-Capillary 183 (*)   ? All other components within normal limits  ?CBG MONITORING, ED  ? ? ? ?EKG ? ? ? ? ?RADIOLOGY ? ? ? ?No results found. ? ?PROCEDURES: ? ?  Critical Care performed: No ? ?Procedures ? ? ?MEDICATIONS ORDERED IN ED: ?Medications - No data to display ? ? ?IMPRESSION / MDM / ASSESSMENT AND PLAN / ED COURSE  ?I reviewed the triage vital signs and the nursing notes. ?             ?               ? ?Differential diagnosis includes, but is not limited to, hyperglycemia, hyperosmolar state, DKA, hyperglycemia, UTI ? ? ?Patient's diagnosis is consistent with hyperglycemia, UTI.  Patient presented to the emergency department after having an elevated glucose reading.  Patient's glucose readings here in the emergency department are reassuring with a fingerstick of 643 and metabolic panel of 329.  Patient also has what appears to be a UTI off of her urinalysis.  Given the increased urination without other concerning diabetic signs I have no concern that the patient was in DKA or hyperosmolar earlier.  She likely had stress hyperglycemia from her infection plus eating ice cream.  No indication for  insulin or fluids at this time.  She will be started on antibiotics for UTI.  She has a close follow-up next week with primary care for management of her diabetes..  Patient is given ED precautions to return to the ED for any worsening or new symptoms. ? ? ? ?  ? ? ?FINAL CLINICAL IMPRESSION(S) / ED DIAGNOSES  ? ?Final diagnoses:  ?Lower urinary tract infectious disease  ?Hyperglycemia  ? ? ? ?Rx / DC Orders  ? ?ED Discharge Orders   ? ?      Ordered  ?  cephALEXin (KEFLEX) 500 MG capsule  4 times daily       ? 01/13/22 1934  ? ?  ?  ? ?  ? ? ? ?Note:  This document was prepared using Dragon voice recognition software and may include unintentional dictation errors. ?  ?Darletta Moll, PA-C ?01/13/22 1935 ? ?  ?Nena Polio, MD ?01/13/22 2348 ? ?

## 2022-01-13 NOTE — ED Triage Notes (Signed)
Pt comes with c/o hyperglycemia. Pt states she checked her sugar at home and it was 550. Pt states she took her meds. ? ? ?

## 2022-03-24 ENCOUNTER — Ambulatory Visit: Payer: Medicare Other | Admitting: Critical Care Medicine

## 2022-03-24 NOTE — Progress Notes (Deleted)
New Patient Office Visit  Subjective    Patient ID: Tricia Wilson, female    DOB: August 29, 1956  Age: 66 y.o. MRN: [id number]  CC: No chief complaint on file.   HPI Tricia Wilson presents to establish care Recurrent uti/ renal stones Pcv 20, tdap, pap, colon , mammo, hiv hcv dexa, smoker htn dm renal stones   Outpatient Encounter Medications as of 03/24/2022  Medication Sig   albuterol (VENTOLIN HFA) 108 (90 Base) MCG/ACT inhaler Inhale 2 puffs into the lungs every 4 (four) hours as needed for wheezing or shortness of breath.   amLODipine (NORVASC) 5 MG tablet Take 1 tablet (5 mg total) by mouth daily.   gabapentin (NEURONTIN) 300 MG capsule Take 1 capsule (300 mg total) by mouth 3 (three) times daily.   HYDROcodone-acetaminophen (NORCO/VICODIN) 5-325 MG tablet Take 1 tablet by mouth every 4 (four) hours as needed for moderate pain.   omeprazole (PRILOSEC) 20 MG capsule Take 20 mg by mouth daily. As needed.   ondansetron (ZOFRAN) 4 MG tablet Take 1 tablet (4 mg total) by mouth every 8 (eight) hours as needed for up to 10 doses for nausea or vomiting.   predniSONE (DELTASONE) 50 MG tablet Take 1 tablet (50 mg total) by mouth daily with breakfast.   No facility-administered encounter medications on file as of 03/24/2022.    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   Hypertension    Kidney stone     Past Surgical History:  Procedure Laterality Date   BREAST BIOPSY Right 08/12/2015   US guided biopsy w/ clip placement/SPINDLE CELL PROLIFERATION WITH CYTOLOGIC ATYPIA   BREAST LUMPECTOMY Right 08/29/2015   Procedure: BREAST LUMPECTOMY;  Surgeon: Christene Lye, MD;  Location: [facility] ORS;  Service: General;  Laterality: Right;   BREAST SURGERY Right 05-16-2013   lumpectomy   SHOULDER ARTHROSCOPY Right 1996   [place]    WISDOM TOOTH EXTRACTION      Family History  Problem Relation Age of Onset   Breast cancer Mother    Cancer Paternal Aunt 86       small bowel   Breast cancer Maternal Aunt 41   Breast cancer Sister        younger than age 57   Breast cancer Sister        younger than age 58   Breast cancer Maternal Aunt 67   Breast cancer Cousin 38   Prostate cancer Brother 36   Diabetes Paternal Grandmother    Ovarian cancer Neg Hx     Social History   Socioeconomic History   Marital status: Divorced    Spouse name: Not on file   Number of children: Not on file   Years of education: Not on file   Highest education level: Not on file  Occupational History   Not on file  Tobacco Use   Smoking status: Every Day    Packs/day: 0.25    Years: 20.00    Pack years: 5.00    Types: Cigarettes   Smokeless tobacco: Never  Substance and Sexual Activity   Alcohol use: Yes    Alcohol/week: 0.0 - 1.0 standard drinks    Comment: "occassional alcohol use"   Drug use: No  Sexual activity: Yes    Birth control/protection: Post-menopausal  Other Topics Concern   Not on file  Social History Narrative   Not on file   Social Determinants of Health   Financial Resource Strain: Not on file  Food Insecurity: Not on file  Transportation Needs: Not on file  Physical Activity: Not on file  Stress: Not on file  Social Connections: Not on file  Intimate Partner Violence: Not on file    ROS      Objective    There were no vitals taken for this visit.  Physical Exam  {Labs (Optional):23779}    Assessment & Plan:   Problem List Items Addressed This Visit   None   No follow-ups on file.   Asencion Noble, MD

## 2022-06-03 ENCOUNTER — Other Ambulatory Visit: Payer: Self-pay | Admitting: Student

## 2022-06-03 DIAGNOSIS — Z780 Asymptomatic menopausal state: Secondary | ICD-10-CM

## 2022-06-03 DIAGNOSIS — Z1231 Encounter for screening mammogram for malignant neoplasm of breast: Secondary | ICD-10-CM

## 2023-09-05 ENCOUNTER — Other Ambulatory Visit: Payer: Self-pay | Admitting: Family Medicine

## 2023-09-05 DIAGNOSIS — Z1231 Encounter for screening mammogram for malignant neoplasm of breast: Secondary | ICD-10-CM
